# Patient Record
Sex: Male | Born: 1955 | Race: White | Hispanic: No | Smoking: Former smoker
Health system: Southern US, Community
[De-identification: ages and names within clinical notes are randomized; demographics above are authoritative.]

## PROBLEM LIST (undated history)

## (undated) DIAGNOSIS — R7303 Prediabetes: Secondary | ICD-10-CM

## (undated) DIAGNOSIS — M1990 Unspecified osteoarthritis, unspecified site: Secondary | ICD-10-CM

## (undated) DIAGNOSIS — J439 Emphysema, unspecified: Secondary | ICD-10-CM

## (undated) DIAGNOSIS — K219 Gastro-esophageal reflux disease without esophagitis: Secondary | ICD-10-CM

## (undated) DIAGNOSIS — E785 Hyperlipidemia, unspecified: Secondary | ICD-10-CM

## (undated) DIAGNOSIS — I10 Essential (primary) hypertension: Secondary | ICD-10-CM

## (undated) HISTORY — PX: HERNIA REPAIR: SHX51

## (undated) HISTORY — PX: KNEE SURGERY: SHX244

## (undated) HISTORY — PX: CYSTECTOMY: SUR359

## (undated) HISTORY — PX: OTHER SURGICAL HISTORY: SHX169

---

## 2002-01-11 ENCOUNTER — Encounter: Payer: Self-pay | Admitting: Family Medicine

## 2002-01-11 ENCOUNTER — Ambulatory Visit (HOSPITAL_COMMUNITY): Admission: RE | Admit: 2002-01-11 | Discharge: 2002-01-11 | Payer: Self-pay | Admitting: Family Medicine

## 2003-08-22 ENCOUNTER — Encounter: Payer: Self-pay | Admitting: Family Medicine

## 2003-08-22 ENCOUNTER — Ambulatory Visit (HOSPITAL_COMMUNITY): Admission: RE | Admit: 2003-08-22 | Discharge: 2003-08-22 | Payer: Self-pay | Admitting: Family Medicine

## 2003-11-27 ENCOUNTER — Emergency Department (HOSPITAL_COMMUNITY): Admission: EM | Admit: 2003-11-27 | Discharge: 2003-11-27 | Payer: Self-pay | Admitting: Emergency Medicine

## 2005-02-25 ENCOUNTER — Ambulatory Visit (HOSPITAL_COMMUNITY): Admission: RE | Admit: 2005-02-25 | Discharge: 2005-02-25 | Payer: Self-pay | Admitting: Family Medicine

## 2008-02-09 ENCOUNTER — Emergency Department (HOSPITAL_COMMUNITY): Admission: EM | Admit: 2008-02-09 | Discharge: 2008-02-09 | Payer: Self-pay | Admitting: Emergency Medicine

## 2008-07-07 ENCOUNTER — Inpatient Hospital Stay (HOSPITAL_COMMUNITY): Admission: EM | Admit: 2008-07-07 | Discharge: 2008-07-08 | Payer: Self-pay | Admitting: Emergency Medicine

## 2008-07-10 ENCOUNTER — Encounter (HOSPITAL_COMMUNITY): Admission: RE | Admit: 2008-07-10 | Discharge: 2008-07-28 | Payer: Self-pay | Admitting: Family Medicine

## 2008-07-11 ENCOUNTER — Ambulatory Visit (HOSPITAL_COMMUNITY): Admission: RE | Admit: 2008-07-11 | Discharge: 2008-07-11 | Payer: Self-pay | Admitting: Family Medicine

## 2009-07-24 ENCOUNTER — Ambulatory Visit (HOSPITAL_COMMUNITY): Admission: RE | Admit: 2009-07-24 | Discharge: 2009-07-24 | Payer: Self-pay | Admitting: Family Medicine

## 2010-01-12 ENCOUNTER — Ambulatory Visit (HOSPITAL_COMMUNITY)
Admission: RE | Admit: 2010-01-12 | Discharge: 2010-01-12 | Payer: Self-pay | Admitting: Physical Medicine and Rehabilitation

## 2010-11-23 ENCOUNTER — Encounter: Payer: Self-pay | Admitting: Internal Medicine

## 2010-11-29 ENCOUNTER — Encounter: Payer: Self-pay | Admitting: Internal Medicine

## 2010-12-02 NOTE — Letter (Addendum)
Summary: TRIAGE  TRIAGE   Imported By: Rexene Alberts 11/23/2010 12:22:09  _____________________________________________________________________  External Attachment:    Type:   Image     Comment:   External Document  Appended Document: TRIAGE ok as is

## 2010-12-08 NOTE — Letter (Signed)
Summary: [facility] INSTRUCTIONS  [facility] INSTRUCTIONS   Imported By: Ave Filter 11/29/2010 11:08:35  _____________________________________________________________________  External Attachment:    Type:   Image     Comment:   External Document

## 2010-12-24 ENCOUNTER — Ambulatory Visit (HOSPITAL_COMMUNITY)
Admission: RE | Admit: 2010-12-24 | Payer: BC Managed Care – PPO | Source: Ambulatory Visit | Admitting: Internal Medicine

## 2010-12-24 ENCOUNTER — Encounter: Payer: Self-pay | Admitting: Internal Medicine

## 2011-01-30 HISTORY — PX: OTHER SURGICAL HISTORY: SHX169

## 2011-02-14 ENCOUNTER — Encounter: Payer: BC Managed Care – PPO | Admitting: Internal Medicine

## 2011-02-14 ENCOUNTER — Ambulatory Visit (HOSPITAL_COMMUNITY)
Admission: RE | Admit: 2011-02-14 | Discharge: 2011-02-14 | Disposition: A | Discharge status: Home / Self Care | Payer: BC Managed Care – PPO | Source: Ambulatory Visit | Attending: Internal Medicine | Admitting: Internal Medicine

## 2011-02-14 DIAGNOSIS — K5730 Diverticulosis of large intestine without perforation or abscess without bleeding: Secondary | ICD-10-CM | POA: Insufficient documentation

## 2011-02-14 DIAGNOSIS — Z800 Family history of malignant neoplasm of digestive organs: Secondary | ICD-10-CM | POA: Insufficient documentation

## 2011-02-14 DIAGNOSIS — Z79899 Other long term (current) drug therapy: Secondary | ICD-10-CM | POA: Insufficient documentation

## 2011-02-14 DIAGNOSIS — Z1211 Encounter for screening for malignant neoplasm of colon: Secondary | ICD-10-CM | POA: Insufficient documentation

## 2011-02-14 DIAGNOSIS — I10 Essential (primary) hypertension: Secondary | ICD-10-CM | POA: Insufficient documentation

## 2011-02-15 NOTE — Op Note (Signed)
  NAMENEEMA, FLUEGGE                 ACCOUNT NO.:  [id number]  MEDICAL RECORD NO.:  [id number]           PATIENT TYPE:  O  LOCATION:  DAYP                          FACILITY:  [facility]  PHYSICIAN:  R. Roetta Sessions, M.D. DATE OF BIRTH:  02-16-1956  DATE OF PROCEDURE: DATE OF DISCHARGE:                              OPERATIVE REPORT   INDICATIONS FOR PROCEDURE:  A 55 year old gentleman referred by Dr. Casimiro Needle for screening colonoscopy.  He has no lower GI tract symptoms. No family history of colon polyps or colon cancer.  His first-degree relatives did have colon cancer.  Colonoscopy is now being done as standard screening maneuver.  Risks, benefits, limitations, alternatives, and imponderables have been reviewed, questions answered. Please see his old medical record.  PROCEDURE NOTE:  O2 saturation, blood pressure, pulse, respirations were monitored throughout the entire procedure.  CONSCIOUS SEDATION: 1. Versed 5 mg IV. 2. Demerol 100 mg IV in divided doses.  INSTRUMENT:  Pentax video chip system.  FINDINGS:  Digital rectal exam revealed no abnormalities.  Endoscopic findings:  Prep was adequate.  Colon:  Colonic mucosa was surveyed from the rectosigmoid junction through the left transverse right colon to the appendiceal orifice, ileocecal valve/cecum.  These structures were seen and photographed for the record.  From this level, scope was slowly and cautiously withdrawn.  All previous mucosal surfaces were again seen. The patient had scattered left-sided diverticulum.  Colonic mucosa appeared normal.  Scope was pulled down into the rectum where thorough examination of the rectal mucosa including retroflexed view of the anal verge revealed an abnormal senile anal papilla.  The patient tolerated the procedure well.  Cecal withdrawal time 11 minutes.  IMPRESSION: 1. Senile anal papilla, otherwise normal rectum. 2. Scattered left-sided diverticulum and colonic mucosa appeared  normal.  RECOMMENDATIONS: 1. Diverticulosis literature provided to Mr. Fluke. 2. Recommend repeat screening colonoscopy in 10 years.     Jonathon Bellows, M.D.     RMR/MEDQ  D:  02/14/2011  T:  02/14/2011  Job:  [id number]  Electronically Signed by Lorrin Goodell M.D. on 02/15/2011 07:45:41 AM

## 2011-02-28 ENCOUNTER — Other Ambulatory Visit (HOSPITAL_COMMUNITY): Payer: Self-pay | Admitting: Neurosurgery

## 2011-02-28 DIAGNOSIS — M545 Low back pain, unspecified: Secondary | ICD-10-CM

## 2011-03-02 ENCOUNTER — Other Ambulatory Visit (HOSPITAL_COMMUNITY): Payer: BC Managed Care – PPO

## 2011-03-02 ENCOUNTER — Ambulatory Visit (HOSPITAL_COMMUNITY)
Admission: RE | Admit: 2011-03-02 | Discharge: 2011-03-02 | Disposition: A | Discharge status: Home / Self Care | Payer: BC Managed Care – PPO | Source: Ambulatory Visit | Attending: Neurosurgery | Admitting: Neurosurgery

## 2011-03-02 DIAGNOSIS — M545 Low back pain, unspecified: Secondary | ICD-10-CM

## 2011-03-02 DIAGNOSIS — M25559 Pain in unspecified hip: Secondary | ICD-10-CM | POA: Insufficient documentation

## 2011-03-02 DIAGNOSIS — M5137 Other intervertebral disc degeneration, lumbosacral region: Secondary | ICD-10-CM | POA: Insufficient documentation

## 2011-03-02 DIAGNOSIS — M51379 Other intervertebral disc degeneration, lumbosacral region without mention of lumbar back pain or lower extremity pain: Secondary | ICD-10-CM | POA: Insufficient documentation

## 2011-03-15 NOTE — H&P (Signed)
Marc Garcia, Marc Garcia                 ACCOUNT NO.:  [id number]   MEDICAL RECORD NO.:  [id number]          PATIENT TYPE:  INP   LOCATION:  A307                          FACILITY:  [facility]   PHYSICIAN:  Dorris Singh, DO    DATE OF BIRTH:  June 18, 1956   DATE OF ADMISSION:  07/07/2008  DATE OF DISCHARGE:  LH                              HISTORY & PHYSICAL   PRIMARY CARE PHYSICIAN:  Patrica Duel, M.D.   CHIEF COMPLAINT:  Is back pain.   The patient is a 55 year old Caucasian male who was brought in via EMS  with a complaint of extreme back pain.  Apparently he went to the  bathroom and felt a spasm and it was stated the pain was rated a 10/10  and that he had to lay on the floor and could not move.  He called his  wife who subsequently called EMS to bring him into the hospital.  The  patient states that he has had an episode like this several years ago  but it was not to this extent.  While the patient was in the hospital he  has received over 14 mg of morphine with minimal results.  He states  though since being in the ED, he does have a little bit more movement,  but he is still in a lot of discomfort.  I discussed with the patient  regarding his plan of care that due to the nature of this possibly being  muscle spasms since there was no trauma to the area prior to him having  this incident that NSAIDS and muscle relaxers would be the plan of  course and that we would hold back on a lot of pain medication.  The  patient stated understanding.   PAST MEDICAL HISTORY:  Significant for chronic back pain.   SURGICAL HISTORY:  Significant for hernia repair.   SOCIAL HISTORY:  He is a nondrinker.  No drug abuse and he smokes one-  pack per day.  He has no known drug allergies.   MEDICATIONS:  He is currently on hydrochlorothiazide but we do not have  a dose.   REVIEW OF SYSTEMS:  CONSTITUTIONAL:  Negative for weight loss, fever,  chills.  EYES:  Negative for changes in vision.  Ears,  nose, mouth and  throat negative in hearing, smell or sore throat.  CARDIOVASCULAR:  Negative for chest pain.  RESPIRATORY:  Negative for dyspnea or  shortness of breath.  GASTROINTESTINAL:  Negative for nausea, vomiting,  diarrhea, constipation.  GU:  Negative for dysuria or hesitancy.  MUSCULOSKELETAL:  Positive for back pain.  Positive for back spasm.  SKIN:  Positive for pruritus.  NEURO:  Negative for any syncope or  dizziness.  PSYCHIATRIC:  Negative for depression.  METABOLIC:  Negative  for intolerance to heat or cold.  HEMATOLOGIC:  Negative for anemia.   PHYSICAL EXAM:  VITAL SIGNS:  Temperature 97.7, pulse 57, respirations  20, blood pressure 129/82.  GENERAL:  The patient is a well-developed, well-nourished Caucasian 13-  year-old male who as in no acute distress.  HEENT:  Head is normocephalic, atraumatic.  Eyes: EOMI.  PERRL.  Sclerae  is clear.  No icterus noted.  Ears, nose and mouth are all normal.  Throat is clear.  No erythema or exudate noted.  NECK:  Neck is supple.  No lymphadenopathy.  Full range of motion.  CARDIOVASCULAR:  Regular rate and rhythm.  No murmurs, rubs or gallops.  RESPIRATORY:  Clear to auscultation bilaterally.  No wheezes, rales or  rhonchi.  SPINE:  Lumbar paraspinal tenderness noted.  ABDOMEN:  Soft, nontender, nondistended.  Bowel sounds present in all  four quadrants.  EXTREMITIES:  Full range of motion of upper extremities.  Limited range  of motion of lower extremities.  NEURO:  Cranial nerves II-XII grossly intact.  Alert and oriented x3.  SKIN:  Positive excoriations on lower half of body.  The patient states  he feels like he is itchy all over.  PSYCHIATRIC:  Good affect.  Normal affect.  No anxiety, depression  noted.   LABORATORY DATA:  His BMET that was ordered; sodium 138, potassium 4.6,  chloride 105, carbon dioxide 29, glucose 95, BUN 8, creatinine 0.87.   ASSESSMENT AND PLAN:  1. Intractable back pain.  2. Muscle spasm.   3. Disturbance of gait.   PLAN:  1. Admit the patient to service of [facility].  2. Admit.  Will put the patient on home medications until we get a      dose we do not know with hydrochlorothiazide doses.  We will place      the patient on NSAIDS and muscle relaxers.  We will do GI and DVT      prophylaxis.  Place him on a regular diet and give him IV      hydration.  We will continue to monitor him and have PT, OT to come      take a look at him and give any recommendations.  I suspect the      patient will be discharged within the next 24-48 hours, barring any      complications.  As mentioned before I talked to patient regarding      pain management, that really this is an issue of muscle spasms      which respond better to muscle relaxers and NSAIDS.  So we will      continue on the course.  Also he has been put on IV steroids to see      if this will quicken the process and get him more resolved sooner.      We will also do warm compresses and cold compresses to the area.      Dorris Singh, DO  Electronically Signed     CB/MEDQ  D:  07/07/2008  T:  07/08/2008  Job:  [id number]

## 2011-03-15 NOTE — Discharge Summary (Signed)
NAMEFLORENCIO, Marc Garcia                 ACCOUNT NO.:  [id number]   MEDICAL RECORD NO.:  [id number]          PATIENT TYPE:  INP   LOCATION:  A307                          FACILITY:  [facility]   PHYSICIAN:  Dorris Singh, DO    DATE OF BIRTH:  [birth year]   DATE OF ADMISSION:  07/07/2008  DATE OF DISCHARGE:  09/08/2009LH                               DISCHARGE SUMMARY   ADMISSION DIAGNOSES:  1. Intractable back pain.  2. Back spasms.  3. Disturbance of gait.   DISCHARGE DIAGNOSES:  1. Intractable back pain resolved.  2. Back spasms resolving.   PRIMARY CARE PHYSICIAN:  Patrica Duel, M.D.  His H&P was done by Dr.  Elige Radon.  No testing was done.   CONSULTATIONS:  Physical therapy.   To summarize the patient is a 55 year old Caucasian male who was brought  in via EMS with complaints of extreme back pain.  Apparently went to the  bathroom had extreme spasm and could not stand up and ended up laying on  the floor in which he could not get up.  At that point in time EMS came  and picked him up and brought him to [facility] to be evaluated.  He  rated his pain as 10/10.  He was given several narcotic medications and  benzodiazepines in the ED which did not improve his pain.  At this point  in time it was determined after talking with the ED doctor that he  should be admitted at least in observation to see if we can get his pain  under control and to try to get spasms under control.  He was admitted  then to the service of [facility].   HOSPITAL COURSE:  The patient was started on IV fluids, steroids and  NSAIDS as well as muscle relaxers.  Also recommended that he do a little  bit of exercising and stretching while he was in the bed. The patient  was seen the next day stated that he did have little bit more movement.  It was noted that he was moving more in the bed.  I explained to him  that we would have PT evaluate him and they did.  They recommended that  he have an outpatient rehab  program for core muscle strength once his  back pain has subsided and that he needs a Driggs for gait but this  should be very short term and he needs a home exercise program for him  at this point in time.  It was determined that he was given the  information for outpatient rehab which is what was recommended for him  for about a week.  Since the patient improved and he was able to move  more it was determined that he could be discharged to home and he was  with specific instructions.   DISCHARGE INSTRUCTIONS:  1. He was sent home on all of his home medications which include:      a.     Hydrochlorothiazide which we did not get a dose for and he       did  not receive while he was here because we did not determine his       dose.      b.     Medrol Dosepak use as directed.      c.     Naprosyn 500 mg one p.o. t.i.d. take with food #30.      d.     Skelaxin 800 mg one p.o. q.i.d. #20.  2. He is instructed to follow up with Dr. Nobie Putnam in 1-3 days after      discharge.  3. To increase activity slowly.  4. To use warm cold compresses to region.  5. Daily stretching.  6. He will be PT to evaluate him for home program.  7. He was scheduled to do outpatient rehab starting on Thursday 10th      at 9:00 a.m. so we have set this up for him.   CONDITION ON DISCHARGE:  His condition was stable.   DISPOSITION:  His disposition is to home.      Dorris Singh, DO  Electronically Signed     CB/MEDQ  D:  07/08/2008  T:  07/08/2008  Job:  [id number]   cc:   Patrica Duel, M.D.  Fax: [phone]

## 2011-03-16 ENCOUNTER — Ambulatory Visit (HOSPITAL_COMMUNITY)
Admission: RE | Admit: 2011-03-16 | Discharge: 2011-03-16 | Disposition: A | Discharge status: Home / Self Care | Payer: BC Managed Care – PPO | Source: Ambulatory Visit | Attending: Family Medicine | Admitting: Family Medicine

## 2011-03-16 DIAGNOSIS — M6281 Muscle weakness (generalized): Secondary | ICD-10-CM | POA: Insufficient documentation

## 2011-03-16 DIAGNOSIS — M545 Low back pain, unspecified: Secondary | ICD-10-CM | POA: Insufficient documentation

## 2011-03-16 DIAGNOSIS — I10 Essential (primary) hypertension: Secondary | ICD-10-CM | POA: Insufficient documentation

## 2011-03-16 DIAGNOSIS — IMO0001 Reserved for inherently not codable concepts without codable children: Secondary | ICD-10-CM | POA: Insufficient documentation

## 2011-03-18 ENCOUNTER — Ambulatory Visit (HOSPITAL_COMMUNITY)
Admission: RE | Admit: 2011-03-18 | Discharge: 2011-03-18 | Disposition: A | Discharge status: Home / Self Care | Payer: BC Managed Care – PPO | Source: Ambulatory Visit | Attending: Family Medicine | Admitting: Family Medicine

## 2011-03-22 ENCOUNTER — Ambulatory Visit (HOSPITAL_COMMUNITY)
Admission: RE | Admit: 2011-03-22 | Discharge: 2011-03-22 | Disposition: A | Discharge status: Home / Self Care | Payer: BC Managed Care – PPO | Source: Ambulatory Visit | Attending: Family Medicine | Admitting: Family Medicine

## 2011-03-24 ENCOUNTER — Ambulatory Visit (HOSPITAL_COMMUNITY)
Admission: RE | Admit: 2011-03-24 | Discharge: 2011-03-24 | Disposition: A | Discharge status: Home / Self Care | Payer: BC Managed Care – PPO | Source: Ambulatory Visit | Attending: Family Medicine | Admitting: Family Medicine

## 2011-03-29 ENCOUNTER — Ambulatory Visit (HOSPITAL_COMMUNITY)
Admission: RE | Admit: 2011-03-29 | Discharge: 2011-03-29 | Disposition: A | Discharge status: Home / Self Care | Payer: BC Managed Care – PPO | Source: Ambulatory Visit | Attending: Family Medicine | Admitting: Family Medicine

## 2011-03-31 ENCOUNTER — Ambulatory Visit (HOSPITAL_COMMUNITY)
Admission: RE | Admit: 2011-03-31 | Discharge: 2011-03-31 | Disposition: A | Discharge status: Home / Self Care | Payer: BC Managed Care – PPO | Source: Ambulatory Visit | Attending: Family Medicine | Admitting: Family Medicine

## 2011-04-05 ENCOUNTER — Ambulatory Visit (HOSPITAL_COMMUNITY)
Admission: RE | Admit: 2011-04-05 | Discharge: 2011-04-05 | Disposition: A | Discharge status: Home / Self Care | Payer: BC Managed Care – PPO | Source: Ambulatory Visit | Attending: Neurosurgery | Admitting: Neurosurgery

## 2011-04-05 DIAGNOSIS — M545 Low back pain, unspecified: Secondary | ICD-10-CM | POA: Insufficient documentation

## 2011-04-05 DIAGNOSIS — M6281 Muscle weakness (generalized): Secondary | ICD-10-CM | POA: Insufficient documentation

## 2011-04-05 DIAGNOSIS — I10 Essential (primary) hypertension: Secondary | ICD-10-CM | POA: Insufficient documentation

## 2011-04-05 DIAGNOSIS — IMO0001 Reserved for inherently not codable concepts without codable children: Secondary | ICD-10-CM | POA: Insufficient documentation

## 2011-04-07 ENCOUNTER — Ambulatory Visit (HOSPITAL_COMMUNITY)
Admission: RE | Admit: 2011-04-07 | Discharge: 2011-04-07 | Disposition: A | Discharge status: Home / Self Care | Payer: BC Managed Care – PPO | Source: Ambulatory Visit | Attending: Family Medicine | Admitting: Family Medicine

## 2011-04-13 ENCOUNTER — Inpatient Hospital Stay (HOSPITAL_COMMUNITY)
Admission: RE | Admit: 2011-04-13 | Discharge: 2011-04-13 | Disposition: A | Discharge status: Home / Self Care | Payer: BC Managed Care – PPO | Source: Ambulatory Visit | Attending: Physical Therapy | Admitting: Physical Therapy

## 2011-04-26 ENCOUNTER — Ambulatory Visit (HOSPITAL_COMMUNITY)
Admission: RE | Admit: 2011-04-26 | Discharge: 2011-04-26 | Disposition: A | Discharge status: Home / Self Care | Payer: BC Managed Care – PPO | Source: Ambulatory Visit | Attending: Family Medicine | Admitting: Family Medicine

## 2011-04-28 ENCOUNTER — Ambulatory Visit (HOSPITAL_COMMUNITY)
Admission: RE | Admit: 2011-04-28 | Discharge: 2011-04-28 | Disposition: A | Discharge status: Home / Self Care | Payer: BC Managed Care – PPO | Source: Ambulatory Visit | Attending: Family Medicine | Admitting: Family Medicine

## 2011-05-03 ENCOUNTER — Ambulatory Visit (HOSPITAL_COMMUNITY)
Admission: RE | Admit: 2011-05-03 | Discharge: 2011-05-03 | Disposition: A | Discharge status: Home / Self Care | Payer: BC Managed Care – PPO | Source: Ambulatory Visit | Attending: Neurosurgery | Admitting: Neurosurgery

## 2011-05-03 DIAGNOSIS — M545 Low back pain, unspecified: Secondary | ICD-10-CM | POA: Insufficient documentation

## 2011-05-03 DIAGNOSIS — M6281 Muscle weakness (generalized): Secondary | ICD-10-CM | POA: Insufficient documentation

## 2011-05-03 DIAGNOSIS — I10 Essential (primary) hypertension: Secondary | ICD-10-CM | POA: Insufficient documentation

## 2011-05-03 DIAGNOSIS — IMO0001 Reserved for inherently not codable concepts without codable children: Secondary | ICD-10-CM | POA: Insufficient documentation

## 2011-05-05 ENCOUNTER — Ambulatory Visit (HOSPITAL_COMMUNITY)
Admission: RE | Admit: 2011-05-05 | Discharge: 2011-05-05 | Disposition: A | Discharge status: Home / Self Care | Payer: BC Managed Care – PPO | Source: Ambulatory Visit | Attending: Family Medicine | Admitting: Family Medicine

## 2011-05-10 ENCOUNTER — Ambulatory Visit (HOSPITAL_COMMUNITY)
Admission: RE | Admit: 2011-05-10 | Discharge: 2011-05-10 | Disposition: A | Discharge status: Home / Self Care | Payer: BC Managed Care – PPO | Source: Ambulatory Visit

## 2011-05-10 DIAGNOSIS — M545 Low back pain, unspecified: Secondary | ICD-10-CM | POA: Insufficient documentation

## 2011-05-10 NOTE — Progress Notes (Addendum)
Physical Therapy Treatment Patient Name: Marc Garcia EAVWU'J Date: 05/10/2011  Diagnosis: spondylosis dx code 724.2 Time In: 4:32 Time Out: 5:20    Subjective: 3-4/10 lower back and B- hips pain.  "Go easy on me today, it's been a long one."  When asked about the SI MET pain following last session pt stated pain decreased until sitting for long periods of time then pain associated with transition from sit -- stand.  Objective:     Exercise/Treatments Functional squats, then proper functional lifting 8# box 10 reps with 10# inside box.  Wall slides 10 repsx 5" hold. Prone/quadruped SAR/SLR 15 reps., S/L Clam B 20 reps.  Supine: Bridge 20 reps, Bent knee raise 20 reps, B SLR 20 reps, SAQ 20x 5" hold Bilateral. @FLOW ([id number])@  Assessment: Pt. progressing well towards all goals.  LE strengthening- presented with full knee extension with SAQ L LE.  SI within alignment, no MET required.  Referral request refaxed requesting TENS unit (orinigal request received today with no MD signature so re-wrote the fax cover sheet and refaxed to MD for approval.)  Plan: Re-assess next session.  Juel Burrow 05/10/2011, 4:52 PM

## 2011-05-12 ENCOUNTER — Ambulatory Visit (HOSPITAL_COMMUNITY)
Admission: RE | Admit: 2011-05-12 | Discharge: 2011-05-12 | Disposition: A | Discharge status: Home / Self Care | Payer: BC Managed Care – PPO | Source: Ambulatory Visit | Attending: Family Medicine | Admitting: Family Medicine

## 2011-05-12 NOTE — Progress Notes (Cosign Needed)
  Patient Name: MIQUEAS WHILDEN MRN: [id number] Today's Date: 05/12/2011        Physical Therapy Treatment Note Time In: 4:35 Time Out: 5:00  Subjective: 3-4/10 low back pain, it's pretty much the same do feel stronger and have better body mechanics with work but pain has not improved.  Pt stated no real time to perform HEP with 6, 12 hours work weeks.  Pt able to sit only 15 minutes in car before need to get out of vehicle and stretch back.  Pain scales best 3-4/10 to worst: 6/10.  Objective: 5 STS without UE assistance complete in 7.8 seconds (was 15.3 seconds on re-eval complete on 04/13/2011).   Exercises/Treatments: Warm up with elliptical 5 min L4 20 squats 10 reps functional lifting Bridges 20 reps Bent knee raise 20 reps SLR 20 reps  S/L Clam 20 reps B  Assessment: Improved functional abilities, better body mechanics with lifting, improve LE strength.  Pt stated no change in the pain level over last 4 weeks.  Plan: D/C PT secondary to no functional gains, pt. requested to be D/C secondary to no pain relief.   Charges: Functional assessment Therex: 20 min  Becky Sax, PTA  Juel Burrow 05/12/2011, 4:53 PM

## 2011-05-17 ENCOUNTER — Inpatient Hospital Stay (HOSPITAL_COMMUNITY)
Admission: RE | Admit: 2011-05-17 | Payer: BC Managed Care – PPO | Source: Ambulatory Visit | Admitting: Physical Therapy

## 2011-05-19 ENCOUNTER — Ambulatory Visit (HOSPITAL_COMMUNITY): Payer: BC Managed Care – PPO | Admitting: Physical Therapy

## 2011-05-20 ENCOUNTER — Ambulatory Visit (HOSPITAL_COMMUNITY): Payer: BC Managed Care – PPO | Admitting: Physical Therapy

## 2011-07-26 LAB — COMPREHENSIVE METABOLIC PANEL
ALT: 24
CO2: 24
Calcium: 9.7
Chloride: 109
Creatinine, Ser: 1.4
GFR calc non Af Amer: 53 — ABNORMAL LOW
Glucose, Bld: 145 — ABNORMAL HIGH
Sodium: 144
Total Bilirubin: 0.7

## 2011-07-26 LAB — DIFFERENTIAL
Basophils Absolute: 0
Eosinophils Absolute: 0
Eosinophils Relative: 0
Lymphs Abs: 0.9
Neutrophils Relative %: 79 — ABNORMAL HIGH

## 2011-07-26 LAB — URINALYSIS, ROUTINE W REFLEX MICROSCOPIC
Glucose, UA: NEGATIVE
Leukocytes, UA: NEGATIVE
Nitrite: NEGATIVE
Protein, ur: 100 — AB
Urobilinogen, UA: 0.2

## 2011-07-26 LAB — CBC
Hemoglobin: 16.3
MCHC: 34
MCV: 86
RBC: 5.57
WBC: 7.9

## 2011-07-26 LAB — URINE MICROSCOPIC-ADD ON

## 2011-08-03 LAB — BASIC METABOLIC PANEL
BUN: 11
BUN: 8
CO2: 26
CO2: 29
Chloride: 105
Chloride: 106
Creatinine, Ser: 0.87
Creatinine, Ser: 0.96
Glucose, Bld: 95
Potassium: 3.9
Potassium: 4.6

## 2011-08-03 LAB — URINALYSIS, ROUTINE W REFLEX MICROSCOPIC
Bilirubin Urine: NEGATIVE
Glucose, UA: NEGATIVE
Hgb urine dipstick: NEGATIVE
Ketones, ur: NEGATIVE
Protein, ur: NEGATIVE
pH: 5.5

## 2012-05-11 ENCOUNTER — Other Ambulatory Visit (HOSPITAL_COMMUNITY): Payer: Self-pay | Admitting: Family Medicine

## 2012-05-11 ENCOUNTER — Ambulatory Visit (HOSPITAL_COMMUNITY)
Admission: RE | Admit: 2012-05-11 | Discharge: 2012-05-11 | Disposition: A | Discharge status: Home / Self Care | Payer: Managed Care, Other (non HMO) | Source: Ambulatory Visit | Attending: Family Medicine | Admitting: Family Medicine

## 2012-05-11 DIAGNOSIS — R05 Cough: Secondary | ICD-10-CM | POA: Insufficient documentation

## 2012-05-11 DIAGNOSIS — R9389 Abnormal findings on diagnostic imaging of other specified body structures: Secondary | ICD-10-CM

## 2012-05-11 DIAGNOSIS — R059 Cough, unspecified: Secondary | ICD-10-CM

## 2012-05-11 DIAGNOSIS — F17200 Nicotine dependence, unspecified, uncomplicated: Secondary | ICD-10-CM | POA: Insufficient documentation

## 2013-03-13 ENCOUNTER — Other Ambulatory Visit: Payer: Self-pay | Admitting: *Deleted

## 2013-03-13 MED ORDER — NEBIVOLOL HCL 2.5 MG PO TABS: 2.5000 mg | ORAL_TABLET | Freq: Every day | ORAL | Status: DC

## 2013-03-14 MED ORDER — NEBIVOLOL HCL 2.5 MG PO TABS: 2.5000 mg | ORAL_TABLET | Freq: Every day | ORAL | Status: DC

## 2013-03-14 NOTE — Telephone Encounter (Addendum)
rx faxed to [facility]

## 2013-03-14 NOTE — Addendum Note (Signed)
Addended by: Barrie Dunker on: 03/14/2013 05:14 PM   Modules accepted: Orders

## 2013-08-16 ENCOUNTER — Other Ambulatory Visit (HOSPITAL_COMMUNITY): Payer: Self-pay | Admitting: Family Medicine

## 2013-08-16 ENCOUNTER — Ambulatory Visit (HOSPITAL_COMMUNITY)
Admission: RE | Admit: 2013-08-16 | Discharge: 2013-08-16 | Disposition: A | Discharge status: Home / Self Care | Payer: Managed Care, Other (non HMO) | Source: Ambulatory Visit | Attending: Family Medicine | Admitting: Family Medicine

## 2013-08-16 DIAGNOSIS — I10 Essential (primary) hypertension: Secondary | ICD-10-CM

## 2013-08-16 DIAGNOSIS — R05 Cough: Secondary | ICD-10-CM | POA: Insufficient documentation

## 2013-08-16 DIAGNOSIS — R059 Cough, unspecified: Secondary | ICD-10-CM | POA: Insufficient documentation

## 2013-08-16 DIAGNOSIS — Z87891 Personal history of nicotine dependence: Secondary | ICD-10-CM | POA: Insufficient documentation

## 2013-08-16 DIAGNOSIS — J449 Chronic obstructive pulmonary disease, unspecified: Secondary | ICD-10-CM

## 2013-10-02 ENCOUNTER — Other Ambulatory Visit (HOSPITAL_COMMUNITY): Payer: Self-pay | Admitting: Rheumatology

## 2013-10-02 DIAGNOSIS — M549 Dorsalgia, unspecified: Secondary | ICD-10-CM

## 2013-10-07 ENCOUNTER — Ambulatory Visit (HOSPITAL_COMMUNITY)
Admission: RE | Admit: 2013-10-07 | Discharge: 2013-10-07 | Disposition: A | Discharge status: Home / Self Care | Payer: Managed Care, Other (non HMO) | Source: Ambulatory Visit | Attending: Rheumatology | Admitting: Rheumatology

## 2013-10-07 DIAGNOSIS — M549 Dorsalgia, unspecified: Secondary | ICD-10-CM

## 2013-10-07 DIAGNOSIS — M5126 Other intervertebral disc displacement, lumbar region: Secondary | ICD-10-CM | POA: Insufficient documentation

## 2013-10-07 DIAGNOSIS — M545 Low back pain, unspecified: Secondary | ICD-10-CM | POA: Insufficient documentation

## 2013-10-07 DIAGNOSIS — M47817 Spondylosis without myelopathy or radiculopathy, lumbosacral region: Secondary | ICD-10-CM | POA: Insufficient documentation

## 2013-10-07 DIAGNOSIS — M539 Dorsopathy, unspecified: Secondary | ICD-10-CM | POA: Insufficient documentation

## 2014-05-17 ENCOUNTER — Encounter (HOSPITAL_COMMUNITY): Payer: Self-pay | Admitting: Emergency Medicine

## 2014-05-17 ENCOUNTER — Emergency Department (HOSPITAL_COMMUNITY)
Admission: EM | Admit: 2014-05-17 | Discharge: 2014-05-18 | Disposition: A | Discharge status: Home / Self Care | Payer: Managed Care, Other (non HMO) | Attending: Emergency Medicine | Admitting: Emergency Medicine

## 2014-05-17 ENCOUNTER — Emergency Department (HOSPITAL_COMMUNITY): Payer: Managed Care, Other (non HMO)

## 2014-05-17 DIAGNOSIS — I9589 Other hypotension: Secondary | ICD-10-CM

## 2014-05-17 DIAGNOSIS — Z862 Personal history of diseases of the blood and blood-forming organs and certain disorders involving the immune mechanism: Secondary | ICD-10-CM | POA: Insufficient documentation

## 2014-05-17 DIAGNOSIS — I10 Essential (primary) hypertension: Secondary | ICD-10-CM | POA: Insufficient documentation

## 2014-05-17 DIAGNOSIS — R42 Dizziness and giddiness: Secondary | ICD-10-CM | POA: Insufficient documentation

## 2014-05-17 DIAGNOSIS — R0602 Shortness of breath: Secondary | ICD-10-CM | POA: Insufficient documentation

## 2014-05-17 DIAGNOSIS — Z8639 Personal history of other endocrine, nutritional and metabolic disease: Secondary | ICD-10-CM | POA: Insufficient documentation

## 2014-05-17 DIAGNOSIS — Z79899 Other long term (current) drug therapy: Secondary | ICD-10-CM | POA: Insufficient documentation

## 2014-05-17 HISTORY — DX: Essential (primary) hypertension: I10

## 2014-05-17 HISTORY — DX: Hyperlipidemia, unspecified: E78.5

## 2014-05-17 HISTORY — DX: Prediabetes: R73.03

## 2014-05-17 LAB — CBC
HCT: 35.8 % — ABNORMAL LOW (ref 39.0–52.0)
HEMOGLOBIN: 11.8 g/dL — AB (ref 13.0–17.0)
MCH: 30.5 pg (ref 26.0–34.0)
MCHC: 33 g/dL (ref 30.0–36.0)
MCV: 92.5 fL (ref 78.0–100.0)
Platelets: 175 10*3/uL (ref 150–400)
RBC: 3.87 MIL/uL — AB (ref 4.22–5.81)
RDW: 14.3 % (ref 11.5–15.5)
WBC: 10.4 10*3/uL (ref 4.0–10.5)

## 2014-05-17 LAB — BASIC METABOLIC PANEL
ANION GAP: 12 (ref 5–15)
BUN: 28 mg/dL — ABNORMAL HIGH (ref 6–23)
CO2: 25 mEq/L (ref 19–32)
Calcium: 9.3 mg/dL (ref 8.4–10.5)
Chloride: 103 mEq/L (ref 96–112)
Creatinine, Ser: 1.72 mg/dL — ABNORMAL HIGH (ref 0.50–1.35)
GFR, EST AFRICAN AMERICAN: 49 mL/min — AB (ref >90)
GFR, EST NON AFRICAN AMERICAN: 42 mL/min — AB (ref >90)
GLUCOSE: 104 mg/dL — AB (ref 70–99)
POTASSIUM: 4.4 meq/L (ref 3.7–5.3)
SODIUM: 140 meq/L (ref 137–147)

## 2014-05-17 LAB — CBG MONITORING, ED: Glucose-Capillary: 107 mg/dL — ABNORMAL HIGH (ref 70–99)

## 2014-05-17 LAB — D-DIMER, QUANTITATIVE: D-Dimer, Quant: 1.34 ug/mL-FEU — ABNORMAL HIGH (ref 0.00–0.48)

## 2014-05-17 LAB — I-STAT TROPONIN, ED: TROPONIN I, POC: 0.01 ng/mL (ref 0.00–0.08)

## 2014-05-17 MED ORDER — IOHEXOL 350 MG/ML SOLN
100.0000 mL | Freq: Once | INTRAVENOUS | Status: AC | PRN
  Administered 2014-05-17: 100 mL via INTRAVENOUS

## 2014-05-17 NOTE — ED Provider Notes (Signed)
CSN: [id number]     Arrival date & time 05/17/14  2118 History   First MD Initiated Contact with Patient 05/17/14 2136     Chief Complaint  Patient presents with  . Hypotension    x2 weeks     (Consider location/radiation/quality/duration/timing/severity/associated sxs/prior Treatment) HPI Comments: 58 year old male with a past medical history of hypertension, hyperlipidemia and borderline diabetes presents to the emergency department with his wife complaining of hypotension x2 weeks. Patient states he has been feeling lightheaded at work and has been checking his blood pressure, they have been ranging from 57/40-90/40. States anytime he exerts himself he feels lightheaded in his muscles tense up. He saw his primary care physician 3 weeks ago and was told he was borderline diabetic so he adjusted his diet to be more healthy and has lost 10 pounds. There've been no medication changes. Admits to shortness of breath on exertion. Denies chest pain, fever, chills, nausea, vomiting, abdominal pain, headaches, extremity numbness or weakness, speech changes. Denies history of heart attack or stroke. Denies rectal bleeding. Denies ever having symptoms like this in the past. He has an appointment with his PCP on Wednesday. There have been no new symptoms causing him to come to the emergency department tonight, states his wife advised him to come.  The history is provided by the patient and the spouse.    Past Medical History  Diagnosis Date  . Borderline diabetic   . Hypertension   . Hyperlipidemia    Past Surgical History  Procedure Laterality Date  . Hernia repair    . Knee surgery Right   . Cystectomy     No family history on file. History  Substance Use Topics  . Smoking status: Never Smoker   . Smokeless tobacco: Never Used  . Alcohol Use: No    Review of Systems  Respiratory: Positive for shortness of breath.   Neurological: Positive for light-headedness.  All other systems  reviewed and are negative.     Allergies  Review of patient's allergies indicates no known allergies.  Home Medications   Prior to Admission medications   Medication Sig Start Date End Date Taking? Authorizing Provider  nebivolol (BYSTOLIC) 2.5 MG tablet Take 1 tablet (2.5 mg total) by mouth daily. 03/14/13   Rebecca Eaton, MD   BP 105/62  Pulse 68  Temp(Src) 98.1 F (36.7 C) (Oral)  Resp 18  SpO2 100% Physical Exam  Nursing note and vitals reviewed. Constitutional: He is oriented to person, place, and time. He appears well-developed and well-nourished. No distress.  HENT:  Head: Normocephalic and atraumatic.  Mouth/Throat: Oropharynx is clear and moist.  Eyes: Conjunctivae and EOM are normal. Pupils are equal, round, and reactive to light.  Neck: Normal range of motion. Neck supple. No JVD present.  Cardiovascular: Normal rate, regular rhythm, normal heart sounds and intact distal pulses.   No extremity edema.  Pulmonary/Chest: Effort normal and breath sounds normal. No respiratory distress.  Abdominal: Soft. Bowel sounds are normal. There is no tenderness.  Musculoskeletal: Normal range of motion. He exhibits no edema.  Neurological: He is alert and oriented to person, place, and time. He has normal strength. No sensory deficit.  Speech fluent, goal oriented. Moves limbs without ataxia. Equal grip strength bilateral.  Skin: Skin is warm and dry. He is not diaphoretic.  Psychiatric: He has a normal mood and affect. His behavior is normal.    ED Course  Procedures (including critical care time) Labs Review Labs Reviewed  CBC - Abnormal; Notable for the following:    RBC 3.87 (*)    Hemoglobin 11.8 (*)    HCT 35.8 (*)    All other components within normal limits  BASIC METABOLIC PANEL - Abnormal; Notable for the following:    Glucose, Bld 104 (*)    BUN 28 (*)    Creatinine, Ser 1.72 (*)    GFR calc non Af Amer 42 (*)    GFR calc Af Amer 49 (*)    All other  components within normal limits  D-DIMER, QUANTITATIVE - Abnormal; Notable for the following:    D-Dimer, Quant 1.34 (*)    All other components within normal limits  CBG MONITORING, ED - Abnormal; Notable for the following:    Glucose-Capillary 107 (*)    All other components within normal limits  I-STAT TROPOININ, ED    Imaging Review Dg Chest 2 View  05/17/2014   CLINICAL DATA:  HYPOTENSION  EXAM: CHEST  2 VIEW  COMPARISON:  08/16/2013  FINDINGS: The heart size and mediastinal contours are within normal limits. Both lungs are clear. The visualized skeletal structures are unremarkable.  IMPRESSION: No active cardiopulmonary disease.   Electronically Signed   By: Kathreen Devoid   On: 05/17/2014 22:30   Ct Angio Chest Pe W/cm &/or Wo Cm  05/18/2014   CLINICAL DATA:  Dizziness, low blood pressure  EXAM: CT ANGIOGRAPHY CHEST WITH CONTRAST  TECHNIQUE: Multidetector CT imaging of the chest was performed using the standard protocol during bolus administration of intravenous contrast. Multiplanar CT image reconstructions and MIPs were obtained to evaluate the vascular anatomy.  CONTRAST:  166mL OMNIPAQUE IOHEXOL 350 MG/ML SOLN  COMPARISON:  None.  FINDINGS: There is adequate opacification of the pulmonary arteries. There is no pulmonary embolus. The main pulmonary artery, right main pulmonary artery and left main pulmonary arteries are normal in size. The heart size is normal. There is no pericardial effusion.  The lungs are clear. There is no focal consolidation, pleural effusion or pneumothorax.  There is no axillary, hilar, or mediastinal adenopathy. There are scattered small mediastinal lymph nodes present measuring less than 1 cm.  There is no lytic or blastic osseous lesion.  The visualized portions of the upper abdomen are unremarkable.  Review of the MIP images confirms the above findings.  IMPRESSION: 1. No evidence of pulmonary embolus. 2. No active cardiopulmonary disease.   Electronically Signed    By: Kathreen Devoid   On: 05/18/2014 00:13     EKG Interpretation None      MDM   Final diagnoses:  Other specified hypotension   Patient presenting with hypotension and lightheadedness. He is well appearing and in no apparent distress. Afebrile, vital signs stable. Blood pressure 105/62. Labs, EKG, CXR pending. Pt receiving IV fluids. 10:46 PM D-dimer elevated. Will obtain CT angio chest. Hemoglobin decreased from result in 2009, patient denies any rectal bleeding. Creatinine elevated compared to prior results from 2009. 12:28 AM CT angio without evidence of PE or active cardiopulmonary disease. I discussed pt with Dr. Venora Maples. Symptoms possibly related to his HTN medications and recent lifestyle changes. Will have pt stop bystolic, and will switch azor to just olmesartan. Pt has appt with PCP in 4 days. Will refer pt to cardiology for possible echo. Stable for d/c. Return precautions given. Patient states understanding of treatment care plan and is agreeable.  Case discussed with attending Dr. Venora Maples who agrees with plan of care.   Illene Labrador, PA-C  05/18/14 0031 

## 2014-05-17 NOTE — ED Notes (Signed)
Patient c/o hypotension x 2 weeks. Patient states he was checking his blood pressure at work and getting readings of 60/45, 60/40, 57/40, 90/40. Patient states when he exerts himself he feels dizzy and his muscles feel funny. Patient states he saw PCP 3 weeks ago and was diagnosed borderline diabetic and has since adjusted his diet, but had no medication changes. Patient states he has "tried to see his Dr but has been unable to be seen", states he has an appointment on Wednesday. Patient states nothing has changed to bring him in tonight.

## 2014-05-17 NOTE — ED Notes (Signed)
Pt returned from xray

## 2014-05-17 NOTE — ED Notes (Signed)
If nurse is unavailable, tech will get blood draw

## 2014-05-18 MED ORDER — OLMESARTAN MEDOXOMIL 40 MG PO TABS: 40.0000 mg | ORAL_TABLET | Freq: Every day | ORAL | Status: DC

## 2014-05-18 NOTE — Discharge Instructions (Signed)
Stop taking Azor and bistolic until you see your primary care doctor. Begin taking olmesartan. Return with worsening symptoms.  Hypotension As your heart beats, it forces blood through your arteries. This force is your blood pressure. If your blood pressure is too low for you to go about your normal activities or to support the organs of your body, you have hypotension. Hypotension is also referred to as low blood pressure. When your blood pressure becomes too low, you may not get enough blood to your brain. As a result, you may feel weak, feel lightheaded, or develop a rapid heart rate. In a more severe case, you may faint. CAUSES Various conditions can cause hypotension. These include:  Blood loss.  Dehydration.  Heart or endocrine problems.  Pregnancy.  Severe infection.  Not having a well-balanced diet filled with needed nutrients.  Severe allergic reactions (anaphylaxis). Some medicines, such as blood pressure medicine or water pills (diuretics), may lower your blood pressure below normal. Sometimes taking too much medicine or taking medicine not as directed can cause hypotension. TREATMENT  Hospitalization is sometimes required for hypotension if fluid or blood replacement is needed, if time is needed for medicines to wear off, or if further monitoring is needed. Treatment might include changing your diet, changing your medicines (including medicines aimed at raising your blood pressure), and use of support stockings. HOME CARE INSTRUCTIONS   Drink enough fluids to keep your urine clear or pale yellow.  Take your medicines as directed by your health care provider.  Get up slowly from reclining or sitting positions. This gives your blood pressure a chance to adjust.  Wear support stockings as directed by your health care provider.  Maintain a healthy diet by including nutritious food, such as fruits, vegetables, nuts, whole grains, and lean meats. SEEK MEDICAL CARE IF:  You  have vomiting or diarrhea.  You have a fever for more than 2-3 days.  You feel more thirsty than usual.  You feel weak and tired. SEEK IMMEDIATE MEDICAL CARE IF:   You have chest pain or a fast or irregular heartbeat.  You have a loss of feeling in some part of your body, or you lose movement in your arms or legs.  You have trouble speaking.  You become sweaty or feel lightheaded.  You faint. MAKE SURE YOU:   Understand these instructions.  Will watch your condition.  Will get help right away if you are not doing well or get worse. Document Released: 10/17/2005 Document Revised: 08/07/2013 Document Reviewed: 04/19/2013 [facility] Patient Information 2015 [place], [place]. This information is not intended to replace advice given to you by your health care provider. Make sure you discuss any questions you have with your health care provider.

## 2014-05-18 NOTE — ED Provider Notes (Signed)
Medical screening examination/treatment/procedure(s) were performed by non-physician practitioner and as supervising physician I was immediately available for consultation/collaboration.   EKG Interpretation None          Hoy Morn, MD 05/18/14 0040

## 2014-05-30 ENCOUNTER — Encounter: Payer: Self-pay | Admitting: Cardiology

## 2014-05-30 ENCOUNTER — Ambulatory Visit (INDEPENDENT_AMBULATORY_CARE_PROVIDER_SITE_OTHER): Payer: Managed Care, Other (non HMO) | Admitting: Cardiology

## 2014-05-30 VITALS — BP 112/68 | HR 85 | Ht 72.0 in | Wt 184.0 lb

## 2014-05-30 DIAGNOSIS — I9589 Other hypotension: Secondary | ICD-10-CM

## 2014-05-30 DIAGNOSIS — I10 Essential (primary) hypertension: Secondary | ICD-10-CM

## 2014-05-30 NOTE — Progress Notes (Signed)
Clinical Summary Marc Garcia is a 57 y.o.male seen today as a new patient for the following medical problems.   1. Hypotension - recent ER visit 04/2014 with reported hypotension and dizziness. Reported home SBP in 60s? He was on antihypertensives at home. - in ER bp 105/62 with pulse 68 - bystolic and amlodopine were stopped, continued just on olmesartan.  - pcp stopped his olmesartan as well.  D-dimer 1.34, Cr 1.72 (baseline 0.96), BUN 28, K 4.4, Hgb 11.8, trop negative.  - EKG was done but not scanned into epic - CXR negative, CT PE negative  - reports intential 10 lbs weight loss in 3-4 weeks - lightheadeness has improved, feeling better off benicar. Recently made dietary changes due to borderline diabetes, cut back on carbs.  - denies any chest pain, no SOB, no DOE.  Past Medical History  Diagnosis Date  . Borderline diabetic   . Hypertension   . Hyperlipidemia      No Known Allergies   Current Outpatient Prescriptions  Medication Sig Dispense Refill  . amLODipine-olmesartan (AZOR) 5-40 MG per tablet Take 1 tablet by mouth daily.      Marland Kitchen aspirin EC 81 MG tablet Take 81 mg by mouth daily.      Marland Kitchen atorvastatin (LIPITOR) 10 MG tablet Take 10 mg by mouth daily.      Marland Kitchen etanercept (ENBREL) 50 MG/ML injection Inject 50 mg into the skin once a week. Friday      . folic acid (FOLVITE) 1 MG tablet Take 1 mg by mouth daily.      Marland Kitchen HYDROcodone-acetaminophen (NORCO/VICODIN) 5-325 MG per tablet Take 1 tablet by mouth every 6 (six) hours as needed for moderate pain.      . methotrexate (RHEUMATREX) 2.5 MG tablet Take 10 mg by mouth 2 (two) times a week. Friday & Saturday only. Caution:Chemotherapy. Protect from light.      . nebivolol (BYSTOLIC) 2.5 MG tablet Take 1 tablet (2.5 mg total) by mouth daily.  30 tablet  5  . olmesartan (BENICAR) 40 MG tablet Take 1 tablet (40 mg total) by mouth daily.  30 tablet  0  . pantoprazole (PROTONIX) 40 MG tablet Take 40 mg by mouth daily.        No current facility-administered medications for this visit.     Past Surgical History  Procedure Laterality Date  . Hernia repair    . Knee surgery Right   . Cystectomy       No Known Allergies    No family history on file.   Social History Marc Garcia reports that he has never smoked. He has never used smokeless tobacco. Marc Garcia reports that he does not drink alcohol.   Review of Systems CONSTITUTIONAL: No weight loss, fever, chills, weakness or fatigue.  HEENT: Eyes: No visual loss, blurred vision, double vision or yellow sclerae.No hearing loss, sneezing, congestion, runny nose or sore throat.  SKIN: No rash or itching.  CARDIOVASCULAR: per HPI RESPIRATORY: No shortness of breath, cough or sputum.  GASTROINTESTINAL: No anorexia, nausea, vomiting or diarrhea. No abdominal pain or blood.  GENITOURINARY: No burning on urination, no polyuria NEUROLOGICAL: No headache, dizziness, syncope, paralysis, ataxia, numbness or tingling in the extremities. No change in bowel or bladder control.  MUSCULOSKELETAL: No muscle, back pain, joint pain or stiffness.  LYMPHATICS: No enlarged nodes. No history of splenectomy.  PSYCHIATRIC: No history of depression or anxiety.  ENDOCRINOLOGIC: No reports of sweating, cold or heat intolerance. No  polyuria or polydipsia.  Marland Kitchen   Physical Examination p 85 bp 112/68 Wt 184 lbs BMI 25 Gen: resting comfortably, no acute distress HEENT: no scleral icterus, pupils equal round and reactive, no palptable cervical adenopathy,  CV: RRR, no m/r/g, no JVD, no carotid bruits Resp: Clear to auscultation bilaterally GI: abdomen is soft, non-tender, non-distended, normal bowel sounds, no hepatosplenomegaly MSK: extremities are warm, no edema.  Skin: warm, no rash Neuro:  no focal deficits Psych: appropriate affect   Diagnostic Studies 05/30/14 EKG NSR    Assessment and Plan  1. Hypotension - labs suggest he was hypovolemic. Low bp's likely  related to hypovolemia and home antihypertensives - symptoms improved off hypertensives. Less medication requirement may be related to recent weight loss.  - reports repeat labs, will request from pcp to see if AKI resolved - no indication for cardiac workup at this time. If symptoms return can reconsider at that time. Likely hypovolemia combined with too much bp meds, symptoms now resolved. Encouraged to maintain oral hydration.      Arnoldo Lenis, M.D.

## 2014-05-30 NOTE — Patient Instructions (Signed)
Your physician recommends that you schedule a follow-up appointment in: As Needed   Your physician recommends that you continue on your current medications as directed. Please refer to the Current Medication list given to you today.

## 2014-06-02 ENCOUNTER — Encounter: Payer: Self-pay | Admitting: Cardiology

## 2014-06-12 ENCOUNTER — Other Ambulatory Visit (HOSPITAL_COMMUNITY): Payer: Self-pay | Admitting: Rheumatology

## 2014-06-12 DIAGNOSIS — M25469 Effusion, unspecified knee: Secondary | ICD-10-CM

## 2014-06-12 DIAGNOSIS — M25561 Pain in right knee: Secondary | ICD-10-CM

## 2014-06-17 ENCOUNTER — Ambulatory Visit (HOSPITAL_COMMUNITY): Payer: Managed Care, Other (non HMO)

## 2014-06-20 ENCOUNTER — Ambulatory Visit (HOSPITAL_COMMUNITY)
Admission: RE | Admit: 2014-06-20 | Discharge: 2014-06-20 | Disposition: A | Discharge status: Home / Self Care | Payer: Managed Care, Other (non HMO) | Source: Ambulatory Visit | Attending: Rheumatology | Admitting: Rheumatology

## 2014-06-20 DIAGNOSIS — M25569 Pain in unspecified knee: Secondary | ICD-10-CM | POA: Diagnosis present

## 2014-06-20 DIAGNOSIS — M259 Joint disorder, unspecified: Secondary | ICD-10-CM | POA: Insufficient documentation

## 2014-06-20 DIAGNOSIS — M25669 Stiffness of unspecified knee, not elsewhere classified: Secondary | ICD-10-CM | POA: Insufficient documentation

## 2014-06-20 DIAGNOSIS — M25561 Pain in right knee: Secondary | ICD-10-CM

## 2014-06-20 DIAGNOSIS — M25469 Effusion, unspecified knee: Secondary | ICD-10-CM

## 2014-12-05 ENCOUNTER — Ambulatory Visit (HOSPITAL_COMMUNITY)
Admission: RE | Admit: 2014-12-05 | Discharge: 2014-12-05 | Disposition: A | Discharge status: Home / Self Care | Payer: Managed Care, Other (non HMO) | Source: Ambulatory Visit | Attending: Specialist | Admitting: Specialist

## 2014-12-05 DIAGNOSIS — R2689 Other abnormalities of gait and mobility: Secondary | ICD-10-CM | POA: Insufficient documentation

## 2014-12-05 DIAGNOSIS — R262 Difficulty in walking, not elsewhere classified: Secondary | ICD-10-CM

## 2014-12-05 DIAGNOSIS — M6281 Muscle weakness (generalized): Secondary | ICD-10-CM | POA: Diagnosis not present

## 2014-12-05 DIAGNOSIS — M25561 Pain in right knee: Secondary | ICD-10-CM | POA: Insufficient documentation

## 2014-12-05 DIAGNOSIS — M25661 Stiffness of right knee, not elsewhere classified: Secondary | ICD-10-CM | POA: Diagnosis not present

## 2014-12-05 DIAGNOSIS — R29898 Other symptoms and signs involving the musculoskeletal system: Secondary | ICD-10-CM

## 2014-12-05 DIAGNOSIS — Z9889 Other specified postprocedural states: Secondary | ICD-10-CM

## 2014-12-05 NOTE — Therapy (Signed)
[place], [name], [address] Phone: [phone]   Fax:  [phone]  Physical Therapy Evaluation  Patient Details  Name: Marc Garcia MRN: [id number] Date of Birth: 11/13/1955 Referring Provider:  Sydnee Cabal, MD  Encounter Date: 12/05/2014      PT End of Session - 12/05/14 1632    Visit Number 1   Number of Visits 12   Date for PT Re-Evaluation 01/04/15   Authorization Type [employer]   Authorization - Visit Number 1   Authorization - Number of Visits 12   PT Start Time 1600   PT Stop Time 8502   PT Time Calculation (min) 45 min   Activity Tolerance Patient tolerated treatment well   Behavior During Therapy [facility] for tasks assessed/performed      Past Medical History  Diagnosis Date  . Borderline diabetic   . Hypertension   . Hyperlipidemia     Past Surgical History  Procedure Laterality Date  . Hernia repair    . Knee surgery Right   . Cystectomy      There were no vitals taken for this visit.  Visit Diagnosis:  S/P right knee arthroscopy  Difficulty walking  Knee stiffness, right  Right knee pain  Right leg weakness      Subjective Assessment - 12/05/14 1557    Symptoms pain predominantly with weight bearing.    Pertinent History history of low back pain, no pain recently. 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  Patient had surgery to delay knee replacement. Patient expectly likely surger on Lt knee as well in the near future. WBing as toletrated. Patient instructed by MD to place asmuch wieght on Rt LE as tolerated.  Patient works at [employer]. History of torn cartilage in Rt knee priorto surgery 11 years ago.    Currently in Pain? Yes   Pain Score 1   sitting, 7 with weight bearing.    Pain Location Knee   Pain Orientation Right   Pain Descriptors / Indicators Sharp   Pain Type Surgical pain   Pain Onset In the past 7 days   Pain  Frequency Constant   Aggravating Factors  Weight bearing, bending.    Pain Relieving Factors rest, non-weigth bearing, straightening,           [facility] PT Assessment - 12/05/14 0001    Assessment   Medical Diagnosis Rt knee arthroplasty.    Onset Date 11/28/14   Next MD Visit Sydnee Cabal 01/05/15   Prior Therapy no   Precautions   Precaution Comments WBAT on Rt   Balance Screen   Has the patient fallen in the past 6 months No   Has the patient had a decrease in activity level because of a fear of falling?  No   Is the patient reluctant to leave their home because of a fear of falling?  No   Prior Function   Level of Independence Independent with basic ADLs   Observation/Other Assessments   Focus on Therapeutic Outcomes (FOTO)  67% limited   Functional Tests   Functional tests Sit to Stand;Other;Other2   Other:   Other/ Comments 3D hip excursions, limited ability to weight shift to Rt and Rotate to Rt   Other:   Other/Comments Stairs: difficulty going up adnd down has to use both hand rails.  Gait: limited ability to flex knee during gait.    Posture/Postural Control  Posture Comments WNL   AROM   Right Hip External Rotation  38   Right Hip Internal Rotation  26   Left Hip External Rotation  40   Left Hip Internal Rotation  34   Right Knee Extension -8   Right Knee Flexion 103   Right Ankle Dorsiflexion 6   Left Ankle Dorsiflexion 5   Strength   Right Hip Flexion 5/5   Left Hip Flexion 5/5   Right Knee Flexion 3+/5   Right Knee Extension 3+/5   Left Knee Flexion 5/5   Left Knee Extension 5/5   Right Ankle Dorsiflexion 5/5   Left Ankle Dorsiflexion 5/5                  [facility] Adult PT Treatment/Exercise - 12/05/14 0001    Knee/Hip Exercises: Stretches   Active Hamstring Stretch 3 reps;20 seconds   Active Hamstring Stretch Limitations 3 way to 12"    Hip Flexor Stretch 3 reps;20 seconds   Hip Flexor Stretch Limitations to 12"                  PT Education - 12/05/14 1958    Education provided Yes   Education Details HEP: hip flexor/knee drives and hamstring stretch   Person(s) Educated Patient   Methods Explanation;Demonstration;Handout   Comprehension Verbalized understanding;Returned demonstration          PT Short Term Goals - 12/05/14 1648    PT SHORT TERM GOAL #1   Title Patient will dmoenstrate increased knee flexion AROm to 0 degrees to be able to fully extend knee durign gait   Baseline -8   Time 3   Period Weeks   Status New   PT SHORT TERM GOAL #2   Title Patient will demsontrate increased knee flexion to 125 degrees to be bale to squat to reach to ankle for lifting.    Baseline 103 degrees   Time 3   Period Weeks   Status New   PT SHORT TERM GOAL #3   Title Patient will dmeostrate increased hamstring strength ot 4+/5 to be able to lunge to the floor and ambualte withtou assistive device   Time 3   Period Weeks   Status New   PT SHORT TERM GOAL #4   Title Patient will dmeostrate increased quadriceps strength ot 4+/5 to be able to ambulate up stairs with 1 HHA.    Time 3   Period Weeks   Status New   PT SHORT TERM GOAL #5   Title Patient will be indepent with HEp.    Time 3   Period Weeks   Status New           PT Long Term Goals - 12/05/14 1956    PT LONG TERM GOAL #1   Title Patient will be bale to squat through full depth to reach low boxes   Time 6   Period Weeks   Status New   PT LONG TERM GOAL #2   Title Patient will be able to lift 80lb from the floor to return to work.    Time 6   Period Weeks   Status New   PT LONG TERM GOAL #3   Title patient will be able to go up and down stairs without HHA.    Time 6   Period Weeks   Status New               Plan - 12/05/14 1639  Clinical Impression Statement Patient displays Rt knee stiffness and pain s/p Rt knee arthroplasty resulting in difficulty walking requiring bilateral crutches to improve gait tolerance. Patient  displays improvements in knee AROM following tibal grade 3 and 4 posterior-anterior and anterior to posterior mobilizations resulting in improved agait tolerance and weight bearing. Patient will benefit from skilled phsyical therapy to increase knee flexion and extension to normalize gait and increase strength so patient can lift 80lb from the floor. .    Pt will benefit from skilled therapeutic intervention in order to improve on the following deficits Abnormal gait;Decreased endurance;Impaired flexibility;Decreased strength;Decreased activity tolerance;Difficulty walking;Pain;Decreased range of motion   Rehab Potential Good   PT Frequency 3x / week   PT Duration 6 weeks   PT Treatment/Interventions Gait training;Stair training;Functional mobility training;Patient/family education;Passive range of motion;Manual techniques;Therapeutic exercise;Balance training   PT Next Visit Plan Introduce gastroc and ITband stretches, utilize functional manual reaction techniques to improve gait mechanics, piriformis stretch in supine. Manual techniques to increase knee flexion/extension, anterior knee drives.    PT Home Exercise Plan 3 way hamstring stretch and 3 way hip flexor stretch.    Consulted and Agree with Plan of Care Patient         Problem List Patient Active Problem List   Diagnosis Date Noted  . Lumbago 05/10/2011   Devona Konig PT DPT [place], [name], [address] Phone: [phone]   Fax:  [phone]

## 2014-12-08 ENCOUNTER — Ambulatory Visit (HOSPITAL_COMMUNITY)
Admission: RE | Admit: 2014-12-08 | Discharge: 2014-12-08 | Disposition: A | Discharge status: Home / Self Care | Payer: Managed Care, Other (non HMO) | Source: Ambulatory Visit | Attending: Specialist | Admitting: Specialist

## 2014-12-08 DIAGNOSIS — M25561 Pain in right knee: Secondary | ICD-10-CM

## 2014-12-08 DIAGNOSIS — M25661 Stiffness of right knee, not elsewhere classified: Secondary | ICD-10-CM

## 2014-12-08 DIAGNOSIS — R262 Difficulty in walking, not elsewhere classified: Secondary | ICD-10-CM

## 2014-12-08 DIAGNOSIS — Z9889 Other specified postprocedural states: Secondary | ICD-10-CM

## 2014-12-08 DIAGNOSIS — R29898 Other symptoms and signs involving the musculoskeletal system: Secondary | ICD-10-CM

## 2014-12-08 NOTE — Patient Instructions (Addendum)
Heel Slide   Bend knee and pull heel toward buttocks. Hold _1-2_ seconds. Return. Repeat 10-15 times. Do 2-3 sessions per day.  Quad Set   Slowly tighten thigh muscles, trying to flatten knee to the bed.  Hold for 3-5 seconds.  Repeat 10-15 times, 2-3 sessions per day.    Bridge   Lie back, legs bent to a comfortable position.  Keep body weight even between Rt and Lt leg.  Press through both legs and lift hips up off the bed.  Repeat 10-15 times. Do 1-2 sessions per day, 4 times per week.    Abduction    Lift leg up toward ceiling. Repeat 10-15 times. Do 1-2 sessions per day, 4 times per week.    Extension   Lift leg up in the air and bring it back down.  Repeat 10-15 times. Do 1-2 sessions per day, 4 times per week.

## 2014-12-08 NOTE — Therapy (Signed)
[place], [name], [address] Phone: [phone]   Fax:  [phone]  Physical Therapy Treatment  Patient Details  Name: KALIEL BOLDS MRN: [id number] Date of Birth: 1956/06/04 Referring Provider:  Sydnee Cabal, MD  Encounter Date: 12/08/2014      PT End of Session - 12/08/14 1151    Visit Number 2   Number of Visits 12   Date for PT Re-Evaluation 01/04/15   Authorization Type [employer]   Authorization - Visit Number 2   Authorization - Number of Visits 12      Past Medical History  Diagnosis Date  . Borderline diabetic   . Hypertension   . Hyperlipidemia     Past Surgical History  Procedure Laterality Date  . Hernia repair    . Knee surgery Right   . Cystectomy      There were no vitals taken for this visit.  Visit Diagnosis:  S/P right knee arthroscopy  Difficulty walking  Knee stiffness, right  Right knee pain  Right leg weakness      Subjective Assessment - 12/08/14 1100    Symptoms Pt presents to PT without AD today, reports he has been able to amb without device. No complaints of knee buckling or falls, though pain does increase to 8/10 with WB and flexion of the Rt knee.     Currently in Pain? Yes   Pain Score 0-No pain  8/10 with movement of the knee   Pain Location Knee   Pain Orientation Right          [facility] PT Assessment - 12/08/14 0001    Assessment   Medical Diagnosis Rt knee arthroplasty.    Onset Date 11/28/14   Next MD Visit Sydnee Cabal 01/05/15   AROM   Right Knee Extension -8  was -8   Right Knee Flexion 118  was 103   Left Knee Extension -1   Left Knee Flexion 132                  [facility] Adult PT Treatment/Exercise - 12/08/14 0001    Exercises   Exercises Knee/Hip   Knee/Hip Exercises: Stretches   Active Hamstring Stretch 3 reps;20 seconds   Active Hamstring Stretch Limitations 3 way to 12"    Hip Flexor Stretch 3 reps;20 seconds   Hip  Flexor Stretch Limitations 12" Step    Knee: Self-Stretch to increase Flexion 3 reps;10 seconds   Knee: Self-Stretch Limitations 12" Step between HS stretch   Piriformis Stretch 2 reps;30 seconds   Piriformis Stretch Limitations Seated   Gastroc Stretch 3 reps;30 seconds   Gastroc Stretch Limitations Slantboard   Knee/Hip Exercises: Standing   Heel Raises 10 reps  no UE   Knee Flexion 2 sets;10 reps;Right   Knee Flexion Limitations 1#   Forward Step Up 10 reps;Hand Hold: 1;Step Height: 4";Right   Gait Training VC for increasing foot clearance with increasing knee flexion, and heel/toe gait pattern though limited secodnary to decreased knee extension   Knee/Hip Exercises: Supine   Quad Sets 10 reps;Right   Quad Sets Limitations 3" hold with heel prop   Heel Slides 10 reps;Right   Heel Slides Limitations x5 AROM, x5 AAROM with rope   Bridges 10 reps   Bridges Limitations VC for equal pressure on Rt and Lt LE during bridge   Knee/Hip Exercises: Sidelying   Hip ABduction 2 sets;10 reps;Right   Hip ABduction Limitations 1#  Knee/Hip Exercises: Prone   Hip Extension 2 sets;10 reps;Right   Hip Extension Limitations 1#                PT Education - 12/08/14 1136    Education provided Yes   Education Details HEP : heel slide, quad set, bridge, SLR extension/abduction   Person(s) Educated Patient   Methods Explanation;Demonstration;Handout   Comprehension Verbalized understanding;Returned demonstration          PT Short Term Goals - 12/05/14 1648    PT SHORT TERM GOAL #1   Title Patient will dmoenstrate increased knee flexion AROm to 0 degrees to be able to fully extend knee durign gait   Baseline -8   Time 3   Period Weeks   Status New   PT SHORT TERM GOAL #2   Title Patient will demsontrate increased knee flexion to 125 degrees to be bale to squat to reach to ankle for lifting.    Baseline 103 degrees   Time 3   Period Weeks   Status New   PT SHORT TERM GOAL #3    Title Patient will dmeostrate increased hamstring strength ot 4+/5 to be able to lunge to the floor and ambualte withtou assistive device   Time 3   Period Weeks   Status New   PT SHORT TERM GOAL #4   Title Patient will dmeostrate increased quadriceps strength ot 4+/5 to be able to ambulate up stairs with 1 HHA.    Time 3   Period Weeks   Status New   PT SHORT TERM GOAL #5   Title Patient will be indepent with HEp.    Time 3   Period Weeks   Status New           PT Long Term Goals - 12/05/14 1956    PT LONG TERM GOAL #1   Title Patient will be bale to squat through full depth to reach low boxes   Time 6   Period Weeks   Status New   PT LONG TERM GOAL #2   Title Patient will be able to lift 80lb from the floor to return to work.    Time 6   Period Weeks   Status New   PT LONG TERM GOAL #3   Title patient will be able to go up and down stairs without HHA.    Time 6   Period Weeks   Status New               Plan - 12/08/14 1145    Clinical Impression Statement PT POC initiated, focusing on knee ROM and HEP for mat exercises. ROM assessed today with improvements noted in knee flexion, though none in extension noted; educated pt on importance of continueing HEP focusing of full end range of motion in both directions.  VC during gait in clinic and with exericses (knee flexion onto step or box for stretches) to decrease compensations and normalize movement patterns.  Educated pt to apply ice today after treatment session, as pt heading straight home and reports "i can feel it" by the end of the treatment session.     Pt will benefit from skilled therapeutic intervention in order to improve on the following deficits Abnormal gait;Decreased endurance;Impaired flexibility;Decreased strength;Decreased activity tolerance;Difficulty walking;Pain;Decreased range of motion   Rehab Potential Good   PT Frequency 3x / week   PT Duration 6 weeks   PT Treatment/Interventions Gait  training;Stair training;Functional mobility training;Patient/family education;Passive range of motion;Manual  techniques;Therapeutic exercise;Balance training   PT Next Visit Plan Introduce ITB stretch and progress piriformis stretch to supine if tolerated.  Add TKE for improving knee extension.  Manual techniques to increase knee flexion/extesnion, anterior knee drives.         Problem List Patient Active Problem List   Diagnosis Date Noted  . Lumbago 05/10/2011    Lonna Cobb, DPT [phone]  12/08/2014, 11:52 AM  [place], [name], [address] Phone: [phone]   Fax:  [phone]

## 2014-12-10 ENCOUNTER — Ambulatory Visit (HOSPITAL_COMMUNITY)
Admission: RE | Admit: 2014-12-10 | Discharge: 2014-12-10 | Disposition: A | Discharge status: Home / Self Care | Payer: Managed Care, Other (non HMO) | Source: Ambulatory Visit | Attending: Specialist | Admitting: Specialist

## 2014-12-10 DIAGNOSIS — Z9889 Other specified postprocedural states: Secondary | ICD-10-CM

## 2014-12-10 DIAGNOSIS — R29898 Other symptoms and signs involving the musculoskeletal system: Secondary | ICD-10-CM

## 2014-12-10 DIAGNOSIS — M25661 Stiffness of right knee, not elsewhere classified: Secondary | ICD-10-CM

## 2014-12-10 DIAGNOSIS — M25561 Pain in right knee: Secondary | ICD-10-CM

## 2014-12-10 DIAGNOSIS — R262 Difficulty in walking, not elsewhere classified: Secondary | ICD-10-CM

## 2014-12-10 NOTE — Therapy (Signed)
[place], [name], [address] Phone: [phone]   Fax:  [phone]  Physical Therapy Treatment  Patient Details  Name: AVION KUTZER MRN: [id number] Date of Birth: 1956/02/10 Referring Provider:  Sydnee Cabal, MD  Encounter Date: 12/10/2014      PT End of Session - 12/10/14 1129    Visit Number 3   Number of Visits 12   Date for PT Re-Evaluation 01/04/15   Authorization Type [employer]   Authorization Time Period Certification 25/95/6387-56/43/3295   Authorization - Visit Number 3   Authorization - Number of Visits 12   PT Start Time 1884   PT Stop Time 1153   PT Time Calculation (min) 50 min   Activity Tolerance Patient tolerated treatment well   Behavior During Therapy [facility] for tasks assessed/performed      Past Medical History  Diagnosis Date  . Borderline diabetic   . Hypertension   . Hyperlipidemia     Past Surgical History  Procedure Laterality Date  . Hernia repair    . Knee surgery Right   . Cystectomy      There were no vitals taken for this visit.  Visit Diagnosis:  S/P right knee arthroscopy  Difficulty walking  Knee stiffness, right  Right knee pain  Right leg weakness      Subjective Assessment - 12/10/14 1110    Symptoms Pt stated currently pain free while standing, reports increase pain with knee flexion and c/o knee popping while walking.  Compliant wth HEP dailym   Currently in Pain? No/denies          [facility] PT Assessment - 12/10/14 0001    Assessment   Medical Diagnosis Rt knee arthroplasty.    Onset Date 11/28/14   Next MD Visit Sydnee Cabal 01/05/15   Prior Therapy no   AROM   Right Knee Extension -4   Right Knee Flexion 122                  [facility] Adult PT Treatment/Exercise - 12/10/14 1132    Exercises   Exercises Knee/Hip   Knee/Hip Exercises: Stretches   Active Hamstring Stretch 3 reps;30 seconds   Active Hamstring Stretch Limitations 3  directions on 14in step   Quad Stretch 3 reps;30 seconds   Quad Stretch Limitations prone with rope   Knee: Self-Stretch to increase Flexion Limitations   Knee: Self-Stretch Limitations 3 direction knee drive   Piriformis Stretch 3 reps;30 seconds   Piriformis Stretch Limitations supine figure 4 with towel assistnace   Gastroc Stretch 3 reps;30 seconds   Gastroc Stretch Limitations Slantboard   Knee/Hip Exercises: Standing   Heel Raises 15 reps   Heel Raises Limitations Toe raises no HHA   Terminal Knee Extension Right;15 reps;Theraband   Theraband Level (Terminal Knee Extension) Level 3 (Green)   Gait Training 226 no AD with cueing for knee extensino, heel to toe gait pattern and equalized stance phase following patella mobs to see about "popping"   Knee/Hip Exercises: Supine   Quad Sets 10 reps;Right   Short Arc Quad Sets Right;15 reps   Terminal Knee Extension Right;15 reps   Straight Leg Raises Right;15 reps   Straight Leg Raises Limitations with TKE   Patellar Mobs Complete and instructed to pt and wife   Knee Extension PROM;3 sets   Knee Flexion PROM;1 set                PT Education -  12/10/14 1129    Education provided Yes   Education Details Instructed patella mobs all direcitons    Person(s) Educated Patient;Spouse   Methods Explanation;Demonstration;Tactile cues;Verbal cues   Comprehension Verbalized understanding;Returned demonstration          PT Short Term Goals - 12/10/14 1154    PT SHORT TERM GOAL #1   Title Patient will dmoenstrate increased knee flexion AROm to 0 degrees to be able to fully extend knee durign gait   Baseline 12/10/2014-4 degrees   Status On-going   PT SHORT TERM GOAL #2   Title Patient will demsontrate increased knee flexion to 125 degrees to be bale to squat to reach to ankle for lifting.    Baseline on 12/10/2014 122 degrees    Status On-going   PT SHORT TERM GOAL #3   Title Patient will dmeostrate increased hamstring  strength ot 4+/5 to be able to lunge to the floor and ambualte withtou assistive device   PT SHORT TERM GOAL #4   Title Patient will dmeostrate increased quadriceps strength ot 4+/5 to be able to ambulate up stairs with 1 HHA.    Status On-going   PT SHORT TERM GOAL #5   Title Patient will be indepent with HEp.    Status Achieved           PT Long Term Goals - 12/10/14 1154    PT LONG TERM GOAL #1   Title Patient will be bale to squat through full depth to reach low boxes   PT LONG TERM GOAL #2   Title Patient will be able to lift 80lb from the floor to return to work.    PT LONG TERM GOAL #3   Title patient will be able to go up and down stairs without HHA.                Plan - 12/10/14 1133    Clinical Impression Statement Session focus on improving ROM primarly extension, flexion secondary.  Added TKE exercises in standing, supine and prone to improve knee extension.  Min cueing to improve knee extension with gait and for equlaized stride length.  Added supine piriformis and ITBand stretches to improve hip mobilty.  Pt and wife instructed patella mobs following reports of "knee popping" during gait.  Pt stated popping reduced at end of session following patella mobs. AROM improved tto -4-122 at end of session following stretches, exercises and manual PROM. Pt stated slight increased in pain at end of session, pt encouraged to apply ice for pain and edema control.     PT Next Visit Plan Continue with current PT POC to improve TKE, once extension improves progress to functional strenghtening.        Problem List Patient Active Problem List   Diagnosis Date Noted  . Lumbago 05/10/2011   [name], [place]   [name] 12/10/2014, 11:58 AM  [place], [name], [address] Phone: [phone]   Fax:  [phone]

## 2014-12-12 ENCOUNTER — Encounter (HOSPITAL_COMMUNITY): Payer: Managed Care, Other (non HMO) | Admitting: Physical Therapy

## 2014-12-16 ENCOUNTER — Encounter (HOSPITAL_COMMUNITY): Payer: Managed Care, Other (non HMO) | Admitting: Physical Therapy

## 2014-12-17 ENCOUNTER — Ambulatory Visit (HOSPITAL_COMMUNITY): Payer: Managed Care, Other (non HMO)

## 2014-12-17 DIAGNOSIS — R29898 Other symptoms and signs involving the musculoskeletal system: Secondary | ICD-10-CM

## 2014-12-17 DIAGNOSIS — M25661 Stiffness of right knee, not elsewhere classified: Secondary | ICD-10-CM

## 2014-12-17 DIAGNOSIS — R262 Difficulty in walking, not elsewhere classified: Secondary | ICD-10-CM

## 2014-12-17 DIAGNOSIS — Z9889 Other specified postprocedural states: Secondary | ICD-10-CM

## 2014-12-17 DIAGNOSIS — M25561 Pain in right knee: Secondary | ICD-10-CM | POA: Diagnosis not present

## 2014-12-17 NOTE — Patient Instructions (Signed)
Knee Extension Mobilization: Hang (Prone)   With table supporting thighs, place ____ pound weight on right ankle. Hold 5 minutes and gradually increase as tolerated.  Do 1-2 sessions per day.  http://orth.exer.us/722   Copyright  VHI. All rights reserved.

## 2014-12-17 NOTE — Therapy (Signed)
[place], [name], [address] Phone: [phone]   Fax:  [phone]  Physical Therapy Treatment  Patient Details  Name: Marc Garcia MRN: [id number] Date of Birth: September 22, 1956 Referring Provider:  Sydnee Cabal, MD  Encounter Date: 12/17/2014      PT End of Session - 12/17/14 1141    Visit Number 4   Number of Visits 12   Date for PT Re-Evaluation 01/04/15   Authorization Type [employer]   Authorization Time Period Certification 32/20/2542-70/62/3762   Authorization - Visit Number 4   Authorization - Number of Visits 10   PT Start Time 1102   PT Stop Time 1145   PT Time Calculation (min) 43 min   Activity Tolerance Patient tolerated treatment well   Behavior During Therapy [facility] for tasks assessed/performed      Past Medical History  Diagnosis Date  . Borderline diabetic   . Hypertension   . Hyperlipidemia     Past Surgical History  Procedure Laterality Date  . Hernia repair    . Knee surgery Right   . Cystectomy      There were no vitals taken for this visit.  Visit Diagnosis:  S/P right knee arthroscopy  Difficulty walking  Knee stiffness, right  Right knee pain  Right leg weakness      Subjective Assessment - 12/17/14 1103    Symptoms Pain minimum today, stated increased pain yesterday due to weather.  Compliance with HEP daily.  Reports knee continues to pop while walking, wife has been completeing patella mobs at h9[facility].     Currently in Pain? Yes   Pain Score 2    Pain Location Knee   Pain Orientation Right   Pain Descriptors / Indicators Aching          [facility] PT Assessment - 12/17/14 0001    Assessment   Medical Diagnosis Rt knee arthroplasty.    Onset Date 11/28/14   Next MD Visit Sydnee Cabal 01/05/15   Prior Therapy no   AROM   Right Knee Extension -3   Right Knee Flexion 126                  [facility] Adult PT Treatment/Exercise - 12/17/14 1120    Exercises    Exercises Knee/Hip   Knee/Hip Exercises: Stretches   Active Hamstring Stretch 3 reps;30 seconds   Active Hamstring Stretch Limitations 3 directions on 14in step   Quad Stretch 3 reps;30 seconds   Quad Stretch Limitations prone with rope   Gastroc Stretch 3 reps;30 seconds   Gastroc Stretch Limitations Slantboard   Knee/Hip Exercises: Standing   Terminal Knee Extension Right;20 reps;Theraband   Theraband Level (Terminal Knee Extension) Level 4 (Blue)   Functional Squat 10 reps   Functional Squat Limitations 3D hip excursion   Knee/Hip Exercises: Supine   Short Arc Quad Sets Right;15 reps   Terminal Knee Extension Right;15 reps   Knee Extension PROM;3 sets   Knee/Hip Exercises: Prone   Hip Extension Right;10 reps   Prone Knee Hang 5 minutes   Prone Knee Hang Weights (lbs) manual MFR to hamstrings during knee hang   Other Prone Exercises TKE 10 x5"   Other Prone Exercises PROM for flexion x 1                  PT Short Term Goals - 12/17/14 1121    PT SHORT TERM GOAL #1   Title Patient will dmoenstrate  increased knee flexion AROm to 0 degrees to be able to fully extend knee durign gait   Status On-going   PT SHORT TERM GOAL #2   Title Patient will demsontrate increased knee flexion to 125 degrees to be bale to squat to reach to ankle for lifting.    Status Achieved   PT SHORT TERM GOAL #3   Title Patient will dmeostrate increased hamstring strength ot 4+/5 to be able to lunge to the floor and ambualte withtou assistive device   Status On-going   PT SHORT TERM GOAL #4   Title Patient will dmeostrate increased quadriceps strength ot 4+/5 to be able to ambulate up stairs with 1 HHA.    Status On-going   PT SHORT TERM GOAL #5   Title Patient will be indepent with HEp.    Status Achieved           PT Long Term Goals - 12/17/14 1122    PT LONG TERM GOAL #1   Title Patient will be bale to squat through full depth to reach low boxes   PT LONG TERM GOAL #2   Title  Patient will be able to lift 80lb from the floor to return to work.    PT LONG TERM GOAL #3   Title patient will be able to go up and down stairs without HHA.                Plan - 12/17/14 1141    Clinical Impression Statement Continued session focus on ROM primarily extension. Began prone knee hang with manual MFR to hamstring to improve knee extension.  AROM is improving, 3-126 today.  End of session added 3D hip excursion to improve hip mobiltiy and gluteal strengthening.     PT Next Visit Plan Continue with current PT POC to improve TKE, once extension improves progress to functional strenghtening.        Problem List Patient Active Problem List   Diagnosis Date Noted  . Lumbago 05/10/2011   [name], [place]  [name] 12/17/2014, 12:02 PM  [place] [address] [place], [name], [address] Phone: [phone]   Fax:  [phone]

## 2014-12-19 ENCOUNTER — Ambulatory Visit (HOSPITAL_COMMUNITY): Payer: Managed Care, Other (non HMO) | Admitting: Physical Therapy

## 2014-12-19 DIAGNOSIS — R262 Difficulty in walking, not elsewhere classified: Secondary | ICD-10-CM

## 2014-12-19 DIAGNOSIS — M25561 Pain in right knee: Secondary | ICD-10-CM

## 2014-12-19 DIAGNOSIS — R29898 Other symptoms and signs involving the musculoskeletal system: Secondary | ICD-10-CM

## 2014-12-19 DIAGNOSIS — Z9889 Other specified postprocedural states: Secondary | ICD-10-CM

## 2014-12-19 DIAGNOSIS — M25661 Stiffness of right knee, not elsewhere classified: Secondary | ICD-10-CM

## 2014-12-19 NOTE — Therapy (Signed)
[place], [name], [address] Phone: [phone]   Fax:  [phone]  Physical Therapy Treatment  Patient Details  Name: Marc Garcia MRN: [id number] Date of Birth: [birth year] Referring Provider:  Sydnee Cabal, MD  Encounter Date: 12/19/2014      PT End of Session - 12/19/14 1159    Visit Number 5   Number of Visits 12   Date for PT Re-Evaluation 01/04/15   Authorization Type [employer]   Authorization Time Period Certification 58/52/7782-42/35/3614   Authorization - Visit Number 5   Authorization - Number of Visits 10   PT Start Time 4315   PT Stop Time 1145   PT Time Calculation (min) 43 min   Activity Tolerance Patient tolerated treatment well   Behavior During Therapy [facility] for tasks assessed/performed      Past Medical History  Diagnosis Date  . Borderline diabetic   . Hypertension   . Hyperlipidemia     Past Surgical History  Procedure Laterality Date  . Hernia repair    . Knee surgery Right   . Cystectomy      There were no vitals taken for this visit.  Visit Diagnosis:  S/P right knee arthroscopy  Difficulty walking  Right knee pain  Knee stiffness, right  Right leg weakness      Subjective Assessment - 12/19/14 1109    Symptoms Pain is minimal, notes a little difficulty with swquatting to and from a chair.    Currently in Pain? Yes   Pain Score 1    Pain Location Knee   Pain Orientation Right              [facility] Adult PT Treatment/Exercise - 12/19/14 0001    Knee/Hip Exercises: Stretches   Active Hamstring Stretch Limitations 3 directions on 14in step 10x 3 seconds   Quad Stretch 3 reps;30 seconds   Hip Flexor Stretch Limitations 10x 3seconds  3 way to 14"    Gastroc Stretch 3 reps;30 seconds   Gastroc Stretch Limitations Slantboard   Knee/Hip Exercises: Standing   Heel Raises 10 reps;3 sets   Heel Raises Limitations Toe raises no HHA   Forward Lunges Limitations  8" box wth 3 way UE reach    Forward Step Up 10 reps;Step Height: 8";2 sets   Forward Step Up Limitations no UE assist   Functional Squat 10 reps   Functional Squat Limitations 3D split stance hip excursion   SLS Single leg anterior and lateral LE reach from 2" 10x            PT Short Term Goals - 12/19/14 1206    PT SHORT TERM GOAL #1   Title Patient will dmoenstrate increased knee flexion AROm to 0 degrees to be able to fully extend knee durign gait   Status On-going   PT SHORT TERM GOAL #2   Title Patient will demsontrate increased knee flexion to 125 degrees to be bale to squat to reach to ankle for lifting.    Status Achieved   PT SHORT TERM GOAL #3   Title Patient will demostrate increased hamstring strength ot 4+/5 to be able to lunge to the floor and ambualte withtou assistive device   Status On-going   PT SHORT TERM GOAL #4   Title Patient will dmeostrate increased quadriceps strength ot 4+/5 to be able to ambulate up stairs with 1 HHA.    Status On-going   PT SHORT TERM GOAL #5  Title Patient will be indepent with HEP.    Status Achieved           PT Long Term Goals - 12/19/14 1206    PT LONG TERM GOAL #1   Title Patient will be able to squat through full depth to reach low boxes   Status On-going   PT LONG TERM GOAL #2   Title Patient will be able to lift 80lb from the floor to return to work.    Status On-going   PT LONG TERM GOAL #3   Title patient will be able to go up and down stairs without HHA.    Status On-going               Plan - 12/19/14 1200    Clinical Impression Statement Lunges introduces and minisquats introduced to multiple positionms to bing intorducign improved loading mechanics in anticipation of patient's future return to work. patient cotninued to demonstrate limited quadriceps mobility.. patient cotninues to dmeonstrate hip internal rotation limitations secondary  to glute stiffness and ITBand.   PT Next Visit Plan Conitnue  lunges to 8" box, Progress single leg balance reaches to common matrix directions, and introduce Tensor fascia latea stretch.         Problem List Patient Active Problem List   Diagnosis Date Noted  . Lumbago 05/10/2011   Devona Konig PT DPT [place], [name], [address] Phone: [phone]   Fax:  [phone]

## 2014-12-23 ENCOUNTER — Encounter (HOSPITAL_COMMUNITY): Payer: Managed Care, Other (non HMO) | Admitting: Physical Therapy

## 2014-12-24 ENCOUNTER — Ambulatory Visit (HOSPITAL_COMMUNITY): Payer: Managed Care, Other (non HMO) | Admitting: Physical Therapy

## 2014-12-24 DIAGNOSIS — Z9889 Other specified postprocedural states: Secondary | ICD-10-CM

## 2014-12-24 DIAGNOSIS — R262 Difficulty in walking, not elsewhere classified: Secondary | ICD-10-CM

## 2014-12-24 DIAGNOSIS — M25661 Stiffness of right knee, not elsewhere classified: Secondary | ICD-10-CM

## 2014-12-24 DIAGNOSIS — R29898 Other symptoms and signs involving the musculoskeletal system: Secondary | ICD-10-CM

## 2014-12-24 DIAGNOSIS — M25561 Pain in right knee: Secondary | ICD-10-CM | POA: Diagnosis not present

## 2014-12-24 NOTE — Therapy (Signed)
[place], [name], [address] Phone: [phone]   Fax:  [phone]  Physical Therapy Treatment  Patient Details  Name: Marc Garcia MRN: [id number] Date of Birth: 30-Nov-1955 Referring Provider:  Sydnee Cabal, MD  Encounter Date: 12/24/2014      PT End of Session - 12/24/14 1149    Visit Number 6   Number of Visits 12   Date for PT Re-Evaluation 01/04/15   Authorization Type [employer]   Authorization Time Period Certification 13/24/4010-27/25/3664   Authorization - Visit Number 6   Authorization - Number of Visits 10   PT Start Time 1102   PT Stop Time 1145   PT Time Calculation (min) 43 min   Activity Tolerance Patient tolerated treatment well   Behavior During Therapy [facility] for tasks assessed/performed      Past Medical History  Diagnosis Date  . Borderline diabetic   . Hypertension   . Hyperlipidemia     Past Surgical History  Procedure Laterality Date  . Hernia repair    . Knee surgery Right   . Cystectomy      There were no vitals taken for this visit.  Visit Diagnosis:  S/P right knee arthroscopy  Difficulty walking  Right knee pain  Knee stiffness, right  Right leg weakness      Subjective Assessment - 12/24/14 1104    Symptoms Patient states that he is hurting all over, possibly because of the weather; also states that since last session his R knee has just not felt quite right.    Pertinent History history of low back pain, no pain recently. 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  Patient had surgery to delay knee replacement. Patient expectly likely surger on Lt knee as well in the near future. WBing as toletrated. Patient instructed by MD to place asmuch wieght on Rt LE as tolerated.  Patient works at [employer]. History of torn cartilage in Rt knee priorto surgery 11 years ago.                     [place] Adult PT  Treatment/Exercise - 12/24/14 0001    Ambulation/Gait   Gait Comments Stair training on 4" and 7" steps; mild hip hiking but no circumduction noted, mild pain R knee with stair navigation   Knee/Hip Exercises: Stretches   Active Hamstring Stretch 3 reps;30 seconds   Active Hamstring Stretch Limitations 3 directions on 14in step 10x 3 seconds   ITB Stretch 2 reps;30 seconds   ITB Stretch Limitations sidelying on table   Piriformis Stretch 2 reps;30 seconds   Piriformis Stretch Limitations seated   Gastroc Stretch 3 reps;30 seconds   Gastroc Stretch Limitations slantboard   Knee/Hip Exercises: Standing   Heel Raises 10 reps;1 set   Heel Raises Limitations off of slant board   Forward Lunges 1 set;10 reps;Both   Forward Lunges Limitations 8" box within pain free range due to general R knee soreness on this date   Side Lunges Both;1 set;10 reps   Side Lunges Limitations 8" box; cues for proper form and within painfree range due to knee soreness this   Functional Squat 1 set;10 reps   Functional Squat Limitations Mini-squats within pain free range, cues for proper form   Other Standing Knee Exercises 3D hip excursions with split stance (2 inch box under R leg to reduce pain with side to side excursions)  Other Standing Knee Exercises Facilitation of bilateral hip IR; heel to toe and SLS stances on foam pad, weakness hip stabilizers noted   Manual Therapy   Manual Therapy Joint mobilization   Joint Mobilization Grade 3 joint mobilizations for knee flexion and extension; gentle lateral and superior/inferior mobilization of R patellla                  PT Short Term Goals - 12/19/14 1206    PT SHORT TERM GOAL #1   Title Patient will dmoenstrate increased knee flexion AROm to 0 degrees to be able to fully extend knee durign gait   Status On-going   PT SHORT TERM GOAL #2   Title Patient will demsontrate increased knee flexion to 125 degrees to be bale to squat to reach to ankle for  lifting.    Status Achieved   PT SHORT TERM GOAL #3   Title Patient will demostrate increased hamstring strength ot 4+/5 to be able to lunge to the floor and ambualte withtou assistive device   Status On-going   PT SHORT TERM GOAL #4   Title Patient will dmeostrate increased quadriceps strength ot 4+/5 to be able to ambulate up stairs with 1 HHA.    Status On-going   PT SHORT TERM GOAL #5   Title Patient will be indepent with HEP.    Status Achieved           PT Long Term Goals - 12/19/14 1206    PT LONG TERM GOAL #1   Title Patient will be able to squat through full depth to reach low boxes   Status On-going   PT LONG TERM GOAL #2   Title Patient will be able to lift 80lb from the floor to return to work.    Status On-going   PT LONG TERM GOAL #3   Title patient will be able to go up and down stairs without HHA.    Status On-going               Plan - 12/24/14 1150    Clinical Impression Statement Patient arrived to session with 4/10 pain R knee and stated that the knee had "...just not been feeling right..." since last session. Today's session adjusted accordingly. Functional stretching performed with significant tightness noted bilateral IT bands; joint mobilizations performed to R knee and patella significantly limtied in superior/inferior directions. Performed cauttious lunges and squats within pain free range to avoid further aggravating R knee during session. Difficulty with SLS on foam pad, weakness of hip stabilizers noted.    Pt will benefit from skilled therapeutic intervention in order to improve on the following deficits Abnormal gait;Decreased endurance;Impaired flexibility;Decreased strength;Decreased activity tolerance;Difficulty walking;Pain;Decreased range of motion   Rehab Potential Good   PT Frequency 3x / week   PT Duration 6 weeks   PT Treatment/Interventions Gait training;Stair training;Functional mobility training;Patient/family education;Passive  range of motion;Manual techniques;Therapeutic exercise;Balance training   PT Next Visit Plan Perform lunges to 8" box and squats as tolerated; single leg balance reaches to common matrix and activities on foam surface; continue IT and Piriformis stretches   Consulted and Agree with Plan of Care Patient        Problem List Patient Active Problem List   Diagnosis Date Noted  . Lumbago 05/10/2011    Deniece Ree PT, DPT [place] [address] [place], [name], [address] Phone: [phone]   Fax:  [phone]

## 2014-12-26 ENCOUNTER — Ambulatory Visit (HOSPITAL_COMMUNITY): Payer: Managed Care, Other (non HMO) | Admitting: Physical Therapy

## 2014-12-26 DIAGNOSIS — R262 Difficulty in walking, not elsewhere classified: Secondary | ICD-10-CM

## 2014-12-26 DIAGNOSIS — M25561 Pain in right knee: Secondary | ICD-10-CM | POA: Diagnosis not present

## 2014-12-26 DIAGNOSIS — Z9889 Other specified postprocedural states: Secondary | ICD-10-CM

## 2014-12-26 DIAGNOSIS — R29898 Other symptoms and signs involving the musculoskeletal system: Secondary | ICD-10-CM

## 2014-12-26 DIAGNOSIS — M25661 Stiffness of right knee, not elsewhere classified: Secondary | ICD-10-CM

## 2014-12-26 NOTE — Therapy (Signed)
[place], [name], [address] Phone: [phone]   Fax:  [phone]  Physical Therapy Treatment  Patient Details  Name: DAXTEN KOVALENKO MRN: [id number] Date of Birth: 25-Aug-1956 Referring Provider:  Sydnee Cabal, MD  Encounter Date: 12/26/2014      PT End of Session - 12/26/14 1138    Visit Number 7   Number of Visits 12   Date for PT Re-Evaluation 01/04/15   Authorization Type [employer]   Authorization Time Period Certification 22/12/5425-04/23/7627   Authorization - Visit Number 7   Authorization - Number of Visits 10   PT Start Time 1050   PT Stop Time 1133   PT Time Calculation (min) 43 min   Activity Tolerance Patient tolerated treatment well   Behavior During Therapy [facility] for tasks assessed/performed      Past Medical History  Diagnosis Date  . Borderline diabetic   . Hypertension   . Hyperlipidemia     Past Surgical History  Procedure Laterality Date  . Hernia repair    . Knee surgery Right   . Cystectomy      There were no vitals taken for this visit.  Visit Diagnosis:  S/P right knee arthroscopy  Difficulty walking  Right knee pain  Knee stiffness, right  Right leg weakness      Subjective Assessment - 12/26/14 1050    Symptoms Patient states that he is feeling better today, pain around 2-3/10 R knee   Pertinent History history of low back pain, no pain recently. 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  Patient had surgery to delay knee replacement. Patient expectly likely surger on Lt knee as well in the near future. WBing as toletrated. Patient instructed by MD to place asmuch wieght on Rt LE as tolerated.  Patient works at [employer]. History of torn cartilage in Rt knee priorto surgery 11 years ago.    Currently in Pain? Yes   Pain Score 3    Pain Location Knee   Pain Orientation Right                    [facility] Adult  PT Treatment/Exercise - 12/26/14 0001    Knee/Hip Exercises: Stretches   Active Hamstring Stretch 3 reps;30 seconds   Active Hamstring Stretch Limitations on stairs   ITB Stretch 2 reps;30 seconds   ITB Stretch Limitations sidelying   Piriformis Stretch 2 reps;30 seconds   Piriformis Stretch Limitations seated   Gastroc Stretch 3 reps;30 seconds   Gastroc Stretch Limitations slantboard   Knee/Hip Exercises: Standing   Forward Lunges 1 set;15 reps;Both   Forward Lunges Limitations 8" box   Side Lunges 1 set;15 reps;Both   Side Lunges Limitations 8" box; cues for proper form and within painfree range due to knee soreness this   Functional Squat 10 reps   Functional Squat Limitations sit to stand   Other Standing Knee Exercises 3D hip excursions with split stance (2 inch box under R leg to reduce pain with side to side excursions)   Other Standing Knee Exercises Forward lunges onto BOSU ball   Knee/Hip Exercises: Sidelying   Hip ABduction Both;1 set;10 reps   Hip ABduction Limitations manual cues for proper performance   Knee/Hip Exercises: Prone   Other Prone Exercises Prone hip extensions 1x10   Manual Therapy   Manual Therapy Joint mobilization   Joint Mobilization Grade 3 joint mobilizations for knee  flexion and extension; gentle lateral and superior/inferior mobilization of R patella              Balance Exercises - 12/26/14 1123    Balance Exercises: Standing   Standing Eyes Opened Narrow base of support (BOS);30 secs  foam pad   Standing Eyes Closed Narrow base of support (BOS);30 secs  foam pad   Tandem Stance Eyes open;10 secs;Foam/compliant surface  x3 each side, no upper extremity support   SLS Eyes open;Foam/compliant surface;5 reps;10 secs           PT Education - 12/26/14 1137    Education provided Yes   Education Details Pt and family educated not to attempt hip ABD on floor due to difficulty getting up off of floor at home   Person(s) Educated Patient    Methods Explanation   Comprehension Verbalized understanding          PT Short Term Goals - 12/19/14 1206    PT SHORT TERM GOAL #1   Title Patient will dmoenstrate increased knee flexion AROm to 0 degrees to be able to fully extend knee durign gait   Status On-going   PT SHORT TERM GOAL #2   Title Patient will demsontrate increased knee flexion to 125 degrees to be bale to squat to reach to ankle for lifting.    Status Achieved   PT SHORT TERM GOAL #3   Title Patient will demostrate increased hamstring strength ot 4+/5 to be able to lunge to the floor and ambualte withtou assistive device   Status On-going   PT SHORT TERM GOAL #4   Title Patient will dmeostrate increased quadriceps strength ot 4+/5 to be able to ambulate up stairs with 1 HHA.    Status On-going   PT SHORT TERM GOAL #5   Title Patient will be indepent with HEP.    Status Achieved           PT Long Term Goals - 12/19/14 1206    PT LONG TERM GOAL #1   Title Patient will be able to squat through full depth to reach low boxes   Status On-going   PT LONG TERM GOAL #2   Title Patient will be able to lift 80lb from the floor to return to work.    Status On-going   PT LONG TERM GOAL #3   Title patient will be able to go up and down stairs without HHA.    Status On-going               Plan - 12/26/14 1138    Clinical Impression Statement Patient arrived to session with 2-3/10 pain R knee. Focus on functional strength and balance; noted significant weakness in hip ABD and extensor groups when performed from a gravity eliminated position. Difficulty with eccentric control of rocker board especially in anterior to posterior motion. Patella mobility appeared improved on this date.  Pain 2/10 at end of session.    Pt will benefit from skilled therapeutic intervention in order to improve on the following deficits Abnormal gait;Decreased endurance;Impaired flexibility;Decreased strength;Decreased activity  tolerance;Difficulty walking;Pain;Decreased range of motion   Rehab Potential Good   PT Frequency 3x / week   PT Duration 6 weeks   PT Treatment/Interventions Gait training;Stair training;Functional mobility training;Patient/family education;Passive range of motion;Manual techniques;Therapeutic exercise;Balance training   PT Next Visit Plan Trial 6" box for lunges; squats as tolerated; continue gravity present training for hip extensors and abductors; dynamic balance; continue IT and Piriformis stretches  Consulted and Agree with Plan of Care Patient        Problem List Patient Active Problem List   Diagnosis Date Noted  . Lumbago 05/10/2011    Deniece Ree PT, DPT [place] [address] [place], [name], [address] Phone: [phone]   Fax:  [phone]

## 2014-12-30 ENCOUNTER — Encounter (HOSPITAL_COMMUNITY): Payer: Managed Care, Other (non HMO) | Admitting: Physical Therapy

## 2014-12-31 ENCOUNTER — Ambulatory Visit (HOSPITAL_COMMUNITY): Payer: Managed Care, Other (non HMO) | Attending: Specialist

## 2014-12-31 DIAGNOSIS — R2689 Other abnormalities of gait and mobility: Secondary | ICD-10-CM | POA: Diagnosis not present

## 2014-12-31 DIAGNOSIS — R29898 Other symptoms and signs involving the musculoskeletal system: Secondary | ICD-10-CM

## 2014-12-31 DIAGNOSIS — M25661 Stiffness of right knee, not elsewhere classified: Secondary | ICD-10-CM | POA: Insufficient documentation

## 2014-12-31 DIAGNOSIS — Z9889 Other specified postprocedural states: Secondary | ICD-10-CM

## 2014-12-31 DIAGNOSIS — M25561 Pain in right knee: Secondary | ICD-10-CM | POA: Diagnosis not present

## 2014-12-31 DIAGNOSIS — M6281 Muscle weakness (generalized): Secondary | ICD-10-CM | POA: Diagnosis not present

## 2014-12-31 DIAGNOSIS — R262 Difficulty in walking, not elsewhere classified: Secondary | ICD-10-CM

## 2014-12-31 NOTE — Patient Instructions (Signed)
Knee Extension: Quad Set (Eccentric), (Resistance Band)   Stand holding support, tubing behind fully extended knee. Slowly bend knee for 3-5 seconds. Use blue resistance band. 10-20  reps per set, 1-2 sets per day, 3-5 days per week.  Copyright  VHI. All rights reserved.

## 2014-12-31 NOTE — Therapy (Signed)
[place], [name], [address] Phone: [phone]   Fax:  [phone]  Physical Therapy Treatment  Patient Details  Name: Marc Garcia MRN: [id number] Date of Birth: 01/04/1956 Referring Provider:  Sydnee Cabal, MD  Encounter Date: 12/31/2014      PT End of Session - 12/31/14 1120    Visit Number 8   Number of Visits 12   Date for PT Re-Evaluation 01/04/15   Authorization Type [employer]   Authorization Time Period Certification 58/07/9832-82/50/5397   Authorization - Visit Number 8   Authorization - Number of Visits 10   PT Start Time 1104   PT Stop Time 1154   PT Time Calculation (min) 50 min   Activity Tolerance Patient tolerated treatment well   Behavior During Therapy [facility] for tasks assessed/performed      Past Medical History  Diagnosis Date  . Borderline diabetic   . Hypertension   . Hyperlipidemia     Past Surgical History  Procedure Laterality Date  . Hernia repair    . Knee surgery Right   . Cystectomy      There were no vitals taken for this visit.  Visit Diagnosis:  S/P right knee arthroscopy  Difficulty walking  Right knee pain  Knee stiffness, right  Right leg weakness      Subjective Assessment - 12/31/14 1106    Symptoms Pt stated he has tried walking about half mile daily, takes about 30 minutes to complete.  Pt stated pan continues on lateral aspect Rt knee   Currently in Pain? Yes   Pain Score 4    Pain Location Knee   Pain Orientation Right;Lateral   Pain Descriptors / Indicators Patsi Sears PT Assessment - 12/31/14 1121    Assessment   Medical Diagnosis Rt knee arthroplasty.    Onset Date 11/28/14   Next MD Visit Sydnee Cabal 01/05/15   Prior Therapy no                  [facility] Adult PT Treatment/Exercise - 12/31/14 1113    Exercises   Exercises Knee/Hip   Knee/Hip Exercises: Stretches   Active Hamstring Stretch 3 reps;30 seconds   Active Hamstring Stretch Limitations 14in step 3 directions   Quad Stretch 3 reps;30 seconds   Quad Stretch Limitations prone with rope   ITB Stretch 2 reps;30 seconds   ITB Stretch Limitations standing beside 6in step   Piriformis Stretch 3 reps;30 seconds   Piriformis Stretch Limitations seated   Gastroc Stretch 3 reps;30 seconds   Gastroc Stretch Limitations slantboard   Knee/Hip Exercises: Standing   Forward Lunges Both;15 reps   Forward Lunges Limitations 6in box   Side Lunges 1 set;15 reps;Both   Side Lunges Limitations 6in box with manual facilitation for proper form and technique   Terminal Knee Extension Right;20 reps;Theraband   Theraband Level (Terminal Knee Extension) Level 4 (Blue)   Terminal Knee Extension Limitations HEP   Lateral Step Up Right;10 reps;Hand Hold: 1;Step Height: 4"   Forward Step Up Right;15 reps;Hand Hold: 0;Step Height: 8"   Functional Squat 2 sets;10 reps;15 reps   Functional Squat Limitations 3D split stance Rt forward   SLS Lt 52", Rt 20" max of 5   Gait Training emphasis on heel to toe and equal stride length/stance phase   Other Standing Knee Exercises 3D hip excursions with split stance (2 inch box under R  leg to reduce pain with side to side excursions)   Other Standing Knee Exercises Sidestepping with greentband 2RT; Tandem gait 2RT   Knee/Hip Exercises: Sidelying   Hip ABduction Both;15 reps   Hip ABduction Limitations manual cues for proper form   Knee/Hip Exercises: Prone   Hip Extension Right;2 sets;10 reps   Other Prone Exercises TKE 10 x5"                PT Education - 12/31/14 1140    Education provided Yes   Education Details Standing TKE HEP, blue tband given   Person(s) Educated Patient   Methods Explanation;Demonstration;Handout   Comprehension Verbalized understanding;Returned demonstration          PT Short Term Goals - 12/31/14 1118    PT SHORT TERM GOAL #1   Title Patient will dmoenstrate increased knee  flexion AROm to 0 degrees to be able to fully extend knee durign gait   Status On-going   PT SHORT TERM GOAL #2   Title Patient will demsontrate increased knee flexion to 125 degrees to be bale to squat to reach to ankle for lifting.    Status Achieved   PT SHORT TERM GOAL #3   Title Patient will demostrate increased hamstring strength ot 4+/5 to be able to lunge to the floor and ambualte withtou assistive device   Status On-going   PT SHORT TERM GOAL #4   Title Patient will dmeostrate increased quadriceps strength ot 4+/5 to be able to ambulate up stairs with 1 HHA.    PT SHORT TERM GOAL #5   Title Patient will be indepent with HEP.            PT Long Term Goals - 12/31/14 1119    PT LONG TERM GOAL #1   Title Patient will be able to squat through full depth to reach low boxes   Status On-going   PT LONG TERM GOAL #2   Title Patient will be able to lift 80lb from the floor to return to work.    Status On-going   PT LONG TERM GOAL #3   Title patient will be able to go up and down stairs without HHA.    Status On-going               Plan - 12/31/14 1123    Clinical Impression Statement Began session with gait traning and education on importance of proper heel to toe gait and equalized stance phase and stride length to improve gait mechanics for pain control.  Resumed TKE exercises to improve knee extension with gait.  Pt given blue tband  to begin standing TKE at home.  Pt reported pain reduced following gait training and stretches to improve flexibilty with improved depth noted with squats following quad stretch.  Progress gluteal strenghtening with reduced height with lunges this session for increased loading with therapist facilitation to improve loading and resumed stair training.  Progressed to tandem gait with CGA with ability to regain balance independtly.  No reports of increased pain through session.   PT Next Visit Plan Reassess next session prior MD apt 01/05/2015.   Continue 6in box for lunges, continue gravity present training for hip extension and abductors;  Dynamic balance continue IT and piriformis stretches.  Pt left without HEP printout for standing TKS, please provide next session.          Problem List Patient Active Problem List   Diagnosis Date Noted  . Lumbago 05/10/2011  [name], [place]  [name] 12/31/2014, 12:06 PM  [place], [name], [address] Phone: [phone]   Fax:  [phone]

## 2015-01-02 ENCOUNTER — Ambulatory Visit (HOSPITAL_COMMUNITY): Payer: Managed Care, Other (non HMO) | Admitting: Physical Therapy

## 2015-01-02 DIAGNOSIS — M25561 Pain in right knee: Secondary | ICD-10-CM

## 2015-01-02 DIAGNOSIS — Z9889 Other specified postprocedural states: Secondary | ICD-10-CM

## 2015-01-02 DIAGNOSIS — R262 Difficulty in walking, not elsewhere classified: Secondary | ICD-10-CM

## 2015-01-02 DIAGNOSIS — M25661 Stiffness of right knee, not elsewhere classified: Secondary | ICD-10-CM

## 2015-01-02 DIAGNOSIS — R29898 Other symptoms and signs involving the musculoskeletal system: Secondary | ICD-10-CM

## 2015-01-02 NOTE — Therapy (Signed)
[place], [name], [address] Phone: [phone]   Fax:  [phone]  Physical Therapy Treatment  Patient Details  Name: Marc Garcia MRN: [id number] Date of Birth: Oct 23, 1956 Referring Provider:  Sydnee Cabal, MD  Encounter Date: 01/02/2015      PT End of Session - 01/02/15 1027    Visit Number 9   Number of Visits 20   Date for PT Re-Evaluation 01/04/15   Authorization Type [employer]   Authorization Time Period Certification 38/93/7342-87/68/1157   Authorization - Visit Number 9   Authorization - Number of Visits 20   PT Start Time 1017   PT Stop Time 1100   PT Time Calculation (min) 43 min   Activity Tolerance Patient tolerated treatment well   Behavior During Therapy [facility] for tasks assessed/performed      Past Medical History  Diagnosis Date  . Borderline diabetic   . Hypertension   . Hyperlipidemia     Past Surgical History  Procedure Laterality Date  . Hernia repair    . Knee surgery Right   . Cystectomy      There were no vitals taken for this visit.  Visit Diagnosis:  S/P right knee arthroscopy  Difficulty walking  Right knee pain  Knee stiffness, right  Right leg weakness      Subjective Assessment - 01/02/15 1020    Symptoms patient states his knee is a little sore after walking 1 mile. Patient notes over all he feels he is getting better but ciontinues to have soreness/pain on lateral knee.     Currently in Pain? Yes   Pain Score 3    Pain Location Knee   Pain Orientation Right;Lateral          [facility] PT Assessment - 01/02/15 0001    Assessment   Medical Diagnosis Rt knee arthroplasty.    Onset Date 11/28/14   Next MD Visit Sydnee Cabal 01/05/15   Prior Therapy no   Observation/Other Assessments   Focus on Therapeutic Outcomes (FOTO)  43% limited was 67%   AROM   Right Hip External Rotation  45   Right Hip Internal Rotation  22   Left Hip External Rotation  45   Left Hip Internal Rotation  40   Right Knee Extension -3   Right Knee Flexion 133   Left Knee Extension -1   Left Knee Flexion 132   Strength   Right Knee Flexion 4-/5   Right Knee Extension 4-/5                  [facility] Adult PT Treatment/Exercise - 01/02/15 0001    Knee/Hip Exercises: Stretches   Active Hamstring Stretch Limitations 10x 3" to 12"  3 ways   Quad Stretch 3 reps;30 seconds   Quad Stretch Limitations prone with rope   ITB Stretch Limitations standing beside 6in step 10x 3"    Piriformis Stretch 3 reps;30 seconds   Piriformis Stretch Limitations seated   Gastroc Stretch Limitations standing 10x 3 seconds 3 way   Knee/Hip Exercises: Standing   Forward Lunges Both;15 reps   Forward Lunges Limitations 6in box, also 20x with same side rotation   Side Lunges 1 set;15 reps;Both   Side Lunges Limitations 6in box with manual facilitation for proper form and technique   Functional Squat 2 sets;10 reps;15 reps   Functional Squat Limitations 3D split stance Rt forward   Other Standing Knee Exercises 3D split stance hip  excursions 10x   Other Standing Knee Exercises Sidestepping with greentband 2RT; Tandem gait 2RT                  PT Short Term Goals - 01/02/15 1118    PT SHORT TERM GOAL #1   Title Patient will dmoenstrate increased knee flexion AROm to 0 degrees to be able to fully extend knee durign gait   Status On-going   PT SHORT TERM GOAL #2   Title Patient will demsontrate increased knee flexion to 125 degrees to be bale to squat to reach to ankle for lifting.    Status Achieved   PT SHORT TERM GOAL #3   Title Patient will demostrate increased hamstring strength ot 4+/5 to be able to lunge to the floor and ambualte withtou assistive device   Status On-going   PT SHORT TERM GOAL #4   Title Patient will dmeostrate increased quadriceps strength ot 4+/5 to be able to ambulate up stairs with 1 HHA.    Status On-going   PT SHORT TERM GOAL #5   Title  Patient will be indepent with HEP.    Status Achieved           PT Long Term Goals - 01/02/15 1118    PT LONG TERM GOAL #1   Title Patient will be able to squat through full depth to reach low boxes   Status On-going   PT LONG TERM GOAL #2   Title Patient will be able to lift 80lb from the floor to return to work.    Status On-going   PT LONG TERM GOAL #3   Title patient will be able to go up and down stairs without HHA.    Status On-going               Plan - 01/02/15 1103    Clinical Impression Statement Patient appears to be making good progress in ROm and strength though strength still limited secondary to lateral knee pain atrtributed to tensor fascia latae, lateral quad, piriformis and lateral hamstring limited ROM. Patient gait s much improvd followign squat walk arounds and manual therapy to improve Rt knee extension and hip internal rotation.  Physical therapuy to continue 2x a week for 4 more weeks to reach remaining goals and prepare patient to return to work.    PT Next Visit Plan  Continue 6in box for lunges with same side rotation and cuing to increase hip internal rotation , Continue functional strength training to prepare patient to return to work. ; continue IT and piriformis stretches and manual to increase hip internal rotation  and knee terminal extension.         Problem List Patient Active Problem List   Diagnosis Date Noted  . Lumbago 05/10/2011    Marc Garcia R 01/02/2015, 11:24 AM  [place] [address] [place], [name], [address] Phone: [phone]   Fax:  [phone]

## 2015-01-13 ENCOUNTER — Ambulatory Visit (HOSPITAL_COMMUNITY): Payer: Managed Care, Other (non HMO)

## 2015-01-13 DIAGNOSIS — M25661 Stiffness of right knee, not elsewhere classified: Secondary | ICD-10-CM

## 2015-01-13 DIAGNOSIS — R262 Difficulty in walking, not elsewhere classified: Secondary | ICD-10-CM

## 2015-01-13 DIAGNOSIS — Z9889 Other specified postprocedural states: Secondary | ICD-10-CM

## 2015-01-13 DIAGNOSIS — M25561 Pain in right knee: Secondary | ICD-10-CM

## 2015-01-13 DIAGNOSIS — R29898 Other symptoms and signs involving the musculoskeletal system: Secondary | ICD-10-CM

## 2015-01-13 NOTE — Therapy (Signed)
[place], [name], [address] Phone: [phone]   Fax:  [phone]  Physical Therapy Treatment  Patient Details  Name: Marc Garcia MRN: [id number] Date of Birth: 27-Oct-1956 Referring Provider:  Sydnee Cabal, MD  Encounter Date: 01/13/2015      PT End of Session - 01/13/15 1126    Visit Number 10   Number of Visits 20   Date for PT Re-Evaluation 02/03/15   Authorization Type [employer]   Authorization Time Period Certification 17/40/8144-81/85/6314   Authorization - Visit Number 10   Authorization - Number of Visits 20   PT Start Time 1100   PT Stop Time 1145   PT Time Calculation (min) 45 min   Activity Tolerance Patient tolerated treatment well   Behavior During Therapy [facility] for tasks assessed/performed      Past Medical History  Diagnosis Date  . Borderline diabetic   . Hypertension   . Hyperlipidemia     Past Surgical History  Procedure Laterality Date  . Hernia repair    . Knee surgery Right   . Cystectomy      There were no vitals filed for this visit.  Visit Diagnosis:  S/P right knee arthroscopy  Difficulty walking  Right knee pain  Knee stiffness, right  Right leg weakness      Subjective Assessment - 01/13/15 1101    Symptoms Pt stated he continues to be limited by pain, feels a knee replacement might be on call   Currently in Pain? Yes   Pain Score 4    Pain Location Knee   Pain Orientation Right;Anterior   Pain Descriptors / Indicators Hervey Ard            [facility] PT Assessment - 01/13/15 0001    Assessment   Medical Diagnosis Rt knee arthroplasty.    Onset Date 11/28/14   Next MD Visit Sydnee Cabal 02/16/2015   Prior Therapy no                   [facility] Adult PT Treatment/Exercise - 01/13/15 1107    Exercises   Exercises Knee/Hip   Knee/Hip Exercises: Stretches   Active Hamstring Stretch 3 reps;30 seconds   Active Hamstring Stretch Limitations 14in  step 3 sets lateral hamstring, 1 set medial hamstirng   Quad Stretch 3 reps;30 seconds   Quad Stretch Limitations prone with rope with manual for rectus femoris   ITB Stretch 3 reps;30 seconds   ITB Stretch Limitations standing beside 6in step    Piriformis Stretch 4 reps;30 seconds   Piriformis Stretch Limitations seated   Gastroc Stretch 3 reps;30 seconds   Gastroc Stretch Limitations slantboard   Knee/Hip Exercises: Standing   Heel Raises Limitations   Heel Raises Limitations Heel walking 2RT   Forward Lunges Both;20 reps   Forward Lunges Limitations 6in box with same side rotation   Side Lunges 1 set;15 reps;Both   Side Lunges Limitations 6in box with manual facilitation for proper form and technique   Functional Squat 2 sets;10 reps   Functional Squat Limitations 3D excursion split stance Rt forward; 2 squats then walk around step for IR   Other Standing Knee Exercises Sidestepping with greentband 2RT   Knee/Hip Exercises: Seated   Other Seated Knee Exercises Heel roll outs 10x10" with towel between knees   Knee/Hip Exercises: Prone   Other Prone Exercises TKE 15 x5"  PT Short Term Goals - 01/13/15 1143    PT SHORT TERM GOAL #1   Title Patient will dmoenstrate increased knee flexion AROm to 0 degrees to be able to fully extend knee durign gait   Status On-going   PT SHORT TERM GOAL #2   Title Patient will demsontrate increased knee flexion to 125 degrees to be bale to squat to reach to ankle for lifting.    Status Achieved   PT SHORT TERM GOAL #3   Title Patient will demostrate increased hamstring strength ot 4+/5 to be able to lunge to the floor and ambualte withtou assistive device   Status On-going   PT SHORT TERM GOAL #4   Title Patient will dmeostrate increased quadriceps strength ot 4+/5 to be able to ambulate up stairs with 1 HHA.    Status On-going   PT SHORT TERM GOAL #5   Title Patient will be indepent with HEP.    Status Achieved            PT Long Term Goals - 01/13/15 1143    PT LONG TERM GOAL #1   Title Patient will be able to squat through full depth to reach low boxes   Status On-going   PT LONG TERM GOAL #2   Title Patient will be able to lift 80lb from the floor to return to work.    PT LONG TERM GOAL #3   Title patient will be able to go up and down stairs without HHA.                Plan - 01/13/15 1127    Clinical Impression Statement Session focus on improving internal rotation to  improve loading with gait for pain control.  Conttinued with stretches and excursion exercises to improve hip mobility.  Added seated heel slide outs to improve IR with noted increased  ease crossing legs following.  No reports of increased pain through session.   PT Next Visit Plan  Continue 6in box for lunges with same side rotation and cuing to increase hip internal rotation , Continue functional strength training to prepare patient to return to work. ; continue IT and piriformis stretches and manual to increase hip internal rotation  and knee terminal extension.         Problem List Patient Active Problem List   Diagnosis Date Noted  . Lumbago 05/10/2011   [name], [place]  [name] 01/13/2015, 11:45 AM  [place], [name], [address] Phone: [phone]   Fax:  [phone]

## 2015-01-16 ENCOUNTER — Ambulatory Visit (HOSPITAL_COMMUNITY): Payer: Managed Care, Other (non HMO) | Admitting: Physical Therapy

## 2015-01-16 DIAGNOSIS — M25661 Stiffness of right knee, not elsewhere classified: Secondary | ICD-10-CM

## 2015-01-16 DIAGNOSIS — M25561 Pain in right knee: Secondary | ICD-10-CM

## 2015-01-16 DIAGNOSIS — R29898 Other symptoms and signs involving the musculoskeletal system: Secondary | ICD-10-CM

## 2015-01-16 DIAGNOSIS — R262 Difficulty in walking, not elsewhere classified: Secondary | ICD-10-CM

## 2015-01-16 DIAGNOSIS — Z9889 Other specified postprocedural states: Secondary | ICD-10-CM

## 2015-01-16 NOTE — Therapy (Signed)
[place], [name], [address] Phone: [phone]   Fax:  [phone]  Physical Therapy Treatment  Patient Details  Name: Marc Garcia MRN: [id number] Date of Birth: [birth year] Referring Provider:  Sydnee Cabal, MD  Encounter Date: 01/16/2015      PT End of Session - 01/16/15 1013    Visit Number 11   Number of Visits 20   Date for PT Re-Evaluation 02/03/15   Authorization Type [employer]   Authorization Time Period Certification 65/46/5035-46/56/8127   Authorization - Visit Number 11   Authorization - Number of Visits 20   PT Start Time 0931   PT Stop Time 1014   PT Time Calculation (min) 43 min   Activity Tolerance Patient tolerated treatment well   Behavior During Therapy [facility] for tasks assessed/performed      Past Medical History  Diagnosis Date  . Borderline diabetic   . Hypertension   . Hyperlipidemia     Past Surgical History  Procedure Laterality Date  . Hernia repair    . Knee surgery Right   . Cystectomy      There were no vitals filed for this visit.  Visit Diagnosis:  S/P right knee arthroscopy  Difficulty walking  Right knee pain  Knee stiffness, right  Right leg weakness      Subjective Assessment - 01/16/15 0937    Symptoms patient states mild frutration with progress notes mst difficulty with stiffness in knee upon getting up in the mornign and getting out of bed in the night.    Currently in Pain? Yes   Pain Score 2    Pain Location Knee   Pain Orientation Right                       [facility] Adult PT Treatment/Exercise - 01/16/15 0001    Knee/Hip Exercises: Stretches   Active Hamstring Stretch 3 reps;30 seconds   Active Hamstring Stretch Limitations 14in step 3 sets lateral hamstring,   Quad Stretch 3 reps;30 seconds   Quad Stretch Limitations prone with rope with manual for rectus femoris   Hip Flexor Stretch Limitations 14 inch with same side  rotation    ITB Stretch 3 reps;30 seconds   ITB Stretch Limitations standing beside 6in step    Piriformis Stretch 4 reps;30 seconds   Piriformis Stretch Limitations seated   Gastroc Stretch 2 reps;20 seconds   Gastroc Stretch Limitations slant board toes in   Knee/Hip Exercises: Standing   Forward Lunges Both;20 reps   Forward Lunges Limitations 6in box with same side rotation   Side Lunges 1 set;15 reps;Both   Side Lunges Limitations 6in box with manual facilitation for proper form and technique, no rotation.    Functional Squat 2 sets;10 reps   Functional Squat Limitations 2D walking hip Excursions, 84ft, squat walk arounds 10x to 6" box   Manual Therapy   Manual Therapy Joint mobilization   Joint Mobilization Grade 3 joint mobilizations for knee flexion and extension; gentle lateral and superior/inferior mobilization of R patella                   PT Short Term Goals - 01/13/15 1143    PT SHORT TERM GOAL #1   Title Patient will dmoenstrate increased knee flexion AROm to 0 degrees to be able to fully extend knee durign gait   Status On-going   PT SHORT TERM GOAL #2   Title Patient  will demsontrate increased knee flexion to 125 degrees to be bale to squat to reach to ankle for lifting.    Status Achieved   PT SHORT TERM GOAL #3   Title Patient will demostrate increased hamstring strength ot 4+/5 to be able to lunge to the floor and ambualte withtou assistive device   Status On-going   PT SHORT TERM GOAL #4   Title Patient will dmeostrate increased quadriceps strength ot 4+/5 to be able to ambulate up stairs with 1 HHA.    Status On-going   PT SHORT TERM GOAL #5   Title Patient will be indepent with HEP.    Status Achieved           PT Long Term Goals - 01/13/15 1143    PT LONG TERM GOAL #1   Title Patient will be able to squat through full depth to reach low boxes   Status On-going   PT LONG TERM GOAL #2   Title Patient will be able to lift 80lb from the  floor to return to work.    PT LONG TERM GOAL #3   Title patient will be able to go up and down stairs without HHA.                Plan - 01/16/15 1014    Clinical Impression Statement Session focused on improvign transition zone 1 of gait by increasing Rt hip internal rotation, flexion and shock absorbtion of gait. patient noted no pain at end of session in knee. cues required for proper loadign during lunges.    PT Next Visit Plan Maintain current program for exercises and manual therapy to anhance proper adaptation.         Problem List Patient Active Problem List   Diagnosis Date Noted  . Lumbago 05/10/2011    Cavon Nicolls R 01/16/2015, 10:16 AM  [place], [name], [address] Phone: [phone]   Fax:  [phone]

## 2015-01-20 ENCOUNTER — Ambulatory Visit (HOSPITAL_COMMUNITY): Payer: Managed Care, Other (non HMO) | Admitting: Physical Therapy

## 2015-01-20 DIAGNOSIS — Z9889 Other specified postprocedural states: Secondary | ICD-10-CM

## 2015-01-20 DIAGNOSIS — M25561 Pain in right knee: Secondary | ICD-10-CM | POA: Diagnosis not present

## 2015-01-20 DIAGNOSIS — M25661 Stiffness of right knee, not elsewhere classified: Secondary | ICD-10-CM

## 2015-01-20 DIAGNOSIS — R262 Difficulty in walking, not elsewhere classified: Secondary | ICD-10-CM

## 2015-01-20 DIAGNOSIS — R29898 Other symptoms and signs involving the musculoskeletal system: Secondary | ICD-10-CM

## 2015-01-20 NOTE — Therapy (Signed)
[place], [name], [address] Phone: [phone]   Fax:  [phone]  Physical Therapy Treatment  Patient Details  Name: Marc Garcia MRN: [id number] Date of Birth: 1956/07/01 Referring Provider:  Sydnee Cabal, MD  Encounter Date: 01/20/2015      PT End of Session - 01/20/15 0928    Visit Number 12   Number of Visits 20   Date for PT Re-Evaluation 02/03/15   Authorization Type [employer]   Authorization Time Period Certification 76/19/5093-26/71/2458   Authorization - Visit Number 12   Authorization - Number of Visits 20   PT Start Time 0930   PT Stop Time 1015   PT Time Calculation (min) 45 min   Activity Tolerance Patient tolerated treatment well   Behavior During Therapy [facility] for tasks assessed/performed      Past Medical History  Diagnosis Date  . Borderline diabetic   . Hypertension   . Hyperlipidemia     Past Surgical History  Procedure Laterality Date  . Hernia repair    . Knee surgery Right   . Cystectomy      There were no vitals filed for this visit.  Visit Diagnosis:  S/P right knee arthroscopy  Difficulty walking  Right knee pain  Knee stiffness, right  Right leg weakness                     [facility] Adult PT Treatment/Exercise - 01/20/15 0001    Knee/Hip Exercises: Stretches   Active Hamstring Stretch 3 reps;30 seconds   Active Hamstring Stretch Limitations 14in step 3 sets lateral hamstring,   Quad Stretch 3 reps;30 seconds   Quad Stretch Limitations prone with rope with manual for rectus femoris   Hip Flexor Stretch Limitations 14 inch with same side rotation    ITB Stretch 30 seconds;2 reps   ITB Stretch Limitations standing beside 6in step    Piriformis Stretch 4 reps;30 seconds   Piriformis Stretch Limitations seated   Gastroc Stretch 2 reps;20 seconds   Gastroc Stretch Limitations with eversion focus.    Knee/Hip Exercises: Standing   Forward Lunges  Both;2 sets;10 reps   Forward Lunges Limitations 6in box with same side rotation   Side Lunges 1 set;Both;10 reps   Side Lunges Limitations 6in box with manual facilitation for proper form and technique, no rotation.    Functional Squat Limitations  squat walk arounds 10x to 6" box   Other Standing Knee Exercises 2D walking hip Excursions, 76ft,   Other Standing Knee Exercises Sidestepping with greentband 2RT   Manual Therapy   Manual Therapy Joint mobilization   Joint Mobilization Grade 3 joint mobilizations for knee flexion and extension; gentle lateral and superior/inferior mobilization of R patella                   PT Short Term Goals - 01/13/15 1143    PT SHORT TERM GOAL #1   Title Patient will dmoenstrate increased knee flexion AROm to 0 degrees to be able to fully extend knee durign gait   Status On-going   PT SHORT TERM GOAL #2   Title Patient will demsontrate increased knee flexion to 125 degrees to be bale to squat to reach to ankle for lifting.    Status Achieved   PT SHORT TERM GOAL #3   Title Patient will demostrate increased hamstring strength ot 4+/5 to be able to lunge to the floor and ambualte withtou assistive device  Status On-going   PT SHORT TERM GOAL #4   Title Patient will dmeostrate increased quadriceps strength ot 4+/5 to be able to ambulate up stairs with 1 HHA.    Status On-going   PT SHORT TERM GOAL #5   Title Patient will be indepent with HEP.    Status Achieved           PT Long Term Goals - 01/13/15 1143    PT LONG TERM GOAL #1   Title Patient will be able to squat through full depth to reach low boxes   Status On-going   PT LONG TERM GOAL #2   Title Patient will be able to lift 80lb from the floor to return to work.    PT LONG TERM GOAL #3   Title patient will be able to go up and down stairs without HHA.                Plan - 01/20/15 0955    Clinical Impression Statement transition zone 1 of gait mechanincs maintain  improvements from last session thoguh knee pain continues minimally attributed to still limited transition zone 2 mechnaics with limited hip internal rotation resulting in excessive varus moment and limited ability to torate throughtou pelvis and femur. Noted continued stiffness in hips preventing further improvement in hip internal rotation.    PT Next Visit Plan Maintain current program for exercises and manual therapy to aenhance proper adaptation to improved transition zone 1 and 2 phases of gait. Utilize hip  Internal rotation joint mobilizations and MET to increase ROM.         Problem List Patient Active Problem List   Diagnosis Date Noted  . Lumbago 05/10/2011    Karter Haire R 01/20/2015, 10:12 AM  [place], [name], [address] Phone: [phone]   Fax:  [phone]

## 2015-01-23 ENCOUNTER — Ambulatory Visit (HOSPITAL_COMMUNITY): Payer: Managed Care, Other (non HMO)

## 2015-01-23 DIAGNOSIS — M25561 Pain in right knee: Secondary | ICD-10-CM

## 2015-01-23 DIAGNOSIS — M25661 Stiffness of right knee, not elsewhere classified: Secondary | ICD-10-CM

## 2015-01-23 DIAGNOSIS — Z9889 Other specified postprocedural states: Secondary | ICD-10-CM

## 2015-01-23 DIAGNOSIS — R29898 Other symptoms and signs involving the musculoskeletal system: Secondary | ICD-10-CM

## 2015-01-23 DIAGNOSIS — R262 Difficulty in walking, not elsewhere classified: Secondary | ICD-10-CM

## 2015-01-23 NOTE — Therapy (Signed)
[place], [name], [address] Phone: [phone]   Fax:  [phone]  Physical Therapy Treatment  Patient Details  Name: Marc Garcia MRN: [id number] Date of Birth: [birth year] Referring Provider:  Sydnee Cabal, MD  Encounter Date: 01/23/2015      PT End of Session - 01/23/15 1009    Visit Number 13   Number of Visits 20   Date for PT Re-Evaluation 02/03/15   Authorization Type [employer]   Authorization Time Period Certification 11/08/3233-57/32/2025   Authorization - Visit Number 13   Authorization - Number of Visits 20   PT Start Time 0930   PT Stop Time 1016   PT Time Calculation (min) 46 min   Activity Tolerance Patient tolerated treatment well   Behavior During Therapy [facility] for tasks assessed/performed      Past Medical History  Diagnosis Date  . Borderline diabetic   . Hypertension   . Hyperlipidemia     Past Surgical History  Procedure Laterality Date  . Hernia repair    . Knee surgery Right   . Cystectomy      There were no vitals filed for this visit.  Visit Diagnosis:  S/P right knee arthroscopy  Difficulty walking  Right knee pain  Knee stiffness, right  Right leg weakness      Subjective Assessment - 01/23/15 1003    Symptoms Pt stated he did a lot of exercises, walking and stretches yesterday with incresed soreness following, current pain scale in Rt hip and knee 2/10   Currently in Pain? Yes   Pain Score 2    Pain Location Leg   Pain Orientation Right   Pain Descriptors / Indicators Aching;Hervey Ard            [facility] PT Assessment - 01/23/15 0001    Assessment   Medical Diagnosis Rt knee arthroplasty.    Onset Date 11/28/14   Next MD Visit Sydnee Cabal 02/16/2015   Prior Therapy no                   [facility] Adult PT Treatment/Exercise - 01/23/15 0001    Exercises   Exercises Knee/Hip   Knee/Hip Exercises: Stretches   Active Hamstring Stretch 3  reps;30 seconds   Active Hamstring Stretch Limitations 14in step 3 sets lateral hamstring,   ITB Stretch 30 seconds;2 reps   ITB Stretch Limitations standing beside 6in step    Piriformis Stretch 4 reps;30 seconds   Piriformis Stretch Limitations seated   Gastroc Stretch 3 reps;30 seconds   Gastroc Stretch Limitations with eversion focus.    Knee/Hip Exercises: Standing   Forward Lunges Both;2 sets;10 reps   Forward Lunges Limitations 6in box with same side rotation   Side Lunges 1 set;Both;10 reps   Side Lunges Limitations 6in box with manual facilitation for proper form and technique, no rotation.    Functional Squat 15 reps   Other Standing Knee Exercises 2D walking hip Excursions, 53ft,   Manual Therapy   Manual Therapy Other (comment)   Joint Mobilization Muscle energy technique for Rt SI anterior rotation and pubic clearing to improve alilgnment f/b gait training to improve transition zone 1                  PT Short Term Goals - 01/23/15 1022    PT SHORT TERM GOAL #1   Title Patient will dmoenstrate increased knee flexion AROm to 0 degrees to be able to fully  extend knee durign gait   Status On-going   PT SHORT TERM GOAL #2   Status Achieved   PT SHORT TERM GOAL #3   Title Patient will demostrate increased hamstring strength ot 4+/5 to be able to lunge to the floor and ambualte withtou assistive device   Status On-going   PT SHORT TERM GOAL #4   Title Patient will dmeostrate increased quadriceps strength ot 4+/5 to be able to ambulate up stairs with 1 HHA.    Status On-going   PT SHORT TERM GOAL #5   Title Patient will be indepent with HEP.    Status Achieved           PT Long Term Goals - 01/23/15 1024    PT LONG TERM GOAL #1   Title Patient will be able to squat through full depth to reach low boxes   Status On-going   PT LONG TERM GOAL #2   Title Patient will be able to lift 80lb from the floor to return to work.    Status On-going   PT LONG TERM  GOAL #3   Title patient will be able to go up and down stairs without HHA.    Status On-going               Plan - 01/23/15 1011    Clinical Impression Statement Session focus on improving gait mechanics with foot placement on transition zone 1 and 2 of gait.  Pt c/o increased hip pain with rotation.  Muscle energy techniques complete for Rt SI anterior and pubic clearing to improve hip internal rotatoin. Pt able to demonstrate improvion internal rotation with knee with gait mechanics at end of session.     PT Next Visit Plan Maintain current program for exercises and manual therapy to aenhance proper adaptation to improved transition zone 1 and 2 phases of gait. .         Problem List Patient Active Problem List   Diagnosis Date Noted  . Lumbago 05/10/2011   [name], [place]  [name] 01/23/2015, 10:25 AM  [place], [name], [address] Phone: [phone]   Fax:  [phone]

## 2015-01-27 ENCOUNTER — Ambulatory Visit (HOSPITAL_COMMUNITY): Payer: Managed Care, Other (non HMO) | Admitting: Physical Therapy

## 2015-01-27 DIAGNOSIS — R29898 Other symptoms and signs involving the musculoskeletal system: Secondary | ICD-10-CM

## 2015-01-27 DIAGNOSIS — Z9889 Other specified postprocedural states: Secondary | ICD-10-CM

## 2015-01-27 DIAGNOSIS — R262 Difficulty in walking, not elsewhere classified: Secondary | ICD-10-CM

## 2015-01-27 DIAGNOSIS — M25661 Stiffness of right knee, not elsewhere classified: Secondary | ICD-10-CM

## 2015-01-27 DIAGNOSIS — M25561 Pain in right knee: Secondary | ICD-10-CM | POA: Diagnosis not present

## 2015-01-27 NOTE — Therapy (Signed)
[place], [name], [address] Phone: [phone]   Fax:  [phone]  Physical Therapy Treatment  Patient Details  Name: Marc Garcia MRN: [id number] Date of Birth: 1956-05-24 Referring Provider:  Sydnee Cabal, MD  Encounter Date: 01/27/2015      PT End of Session - 01/27/15 0936    Visit Number 14   Number of Visits 20   Date for PT Re-Evaluation 02/03/15   Authorization Type [employer]   Authorization Time Period Certification 61/44/3154-00/86/7619   Authorization - Visit Number 14   Authorization - Number of Visits 20   PT Start Time 0930   PT Stop Time 1015   PT Time Calculation (min) 45 min   Activity Tolerance Patient tolerated treatment well   Behavior During Therapy [facility] for tasks assessed/performed      Past Medical History  Diagnosis Date  . Borderline diabetic   . Hypertension   . Hyperlipidemia     Past Surgical History  Procedure Laterality Date  . Hernia repair    . Knee surgery Right   . Cystectomy      There were no vitals filed for this visit.  Visit Diagnosis:  S/P right knee arthroscopy  Difficulty walking  Right knee pain  Knee stiffness, right  Right leg weakness      Subjective Assessment - 01/27/15 0930    Symptoms Patient states increased lateral hip pain bilaterally. notes being uncomfortable when trying to sleep.                        [place] Adult PT Treatment/Exercise - 01/27/15 0001    Knee/Hip Exercises: Stretches   Active Hamstring Stretch 3 reps;30 seconds   Active Hamstring Stretch Limitations 14in step 3 sets lateral hamstring,   Quad Stretch 3 reps;30 seconds   Quad Stretch Limitations prone with rope with manual for rectus femoris   Hip Flexor Stretch Limitations 3 way hip drives to 14" box with overhead reach 5x each   ITB Stretch 30 seconds;2 reps   ITB Stretch Limitations standing beside 6in step    Piriformis Stretch 4 reps;30  seconds   Piriformis Stretch Limitations seated   Gastroc Stretch 3 reps;30 seconds   Gastroc Stretch Limitations with eversion focus.    Knee/Hip Exercises: Standing   Forward Lunges Both;2 sets;10 reps   Forward Lunges Limitations 6in box with same side rotation   Side Lunges 1 set;Both;10 reps   Side Lunges Limitations 4in box with manual facilitation for proper form and technique, no rotation. knee high reach    Terminal Knee Extension 20 reps   Theraband Level (Terminal Knee Extension) Level 4 (Blue)   Step Down Step Height: 2";Hand Hold: 1;10 reps   Functional Squat Limitations  squat walk arounds 10x to 6" box   Other Standing Knee Exercises 2D walking hip Excursions, 19ft,   Other Standing Knee Exercises Static lunge to floor with functional manual reactions to improve gait mechanics. 2x20 each.    Manual Therapy   Manual Therapy Joint mobilization   Joint Mobilization Grade 3 joint mobilizations for knee flexion and extension; gentle lateral and superior/inferior mobilization of R patella                   PT Short Term Goals - 01/23/15 1022    PT SHORT TERM GOAL #1   Title Patient will dmoenstrate increased knee flexion AROm to 0 degrees to be  able to fully extend knee durign gait   Status On-going   PT SHORT TERM GOAL #2   Status Achieved   PT SHORT TERM GOAL #3   Title Patient will demostrate increased hamstring strength ot 4+/5 to be able to lunge to the floor and ambualte withtou assistive device   Status On-going   PT SHORT TERM GOAL #4   Title Patient will dmeostrate increased quadriceps strength ot 4+/5 to be able to ambulate up stairs with 1 HHA.    Status On-going   PT SHORT TERM GOAL #5   Title Patient will be indepent with HEP.    Status Achieved           PT Long Term Goals - 01/23/15 1024    PT LONG TERM GOAL #1   Title Patient will be able to squat through full depth to reach low boxes   Status On-going   PT LONG TERM GOAL #2   Title  Patient will be able to lift 80lb from the floor to return to work.    Status On-going   PT LONG TERM GOAL #3   Title patient will be able to go up and down stairs without HHA.    Status On-going               Plan - 01/27/15 0956    Clinical Impression Statement Session intiially focused on decreasing hip pain/stiffness then conitnued focus on improving gait mechanics with foot placement on transition zone 1 and 2 of gait. Pt c/o increased hip pain with rotation. Muscle energy techniques complete for Rt SI anterior and pubic clearing to improve hip internal rotatoin. Pt able to demonstrate improvion internal rotation with knee with gait mechanics at end of session.    PT Next Visit Plan Maintain current program for exercises and manual therapy to enhance proper adaptation to improved transition zone 1 and 2 phases of gait increasing hip internal rotation and knee extension.         Problem List Patient Active Problem List   Diagnosis Date Noted  . Lumbago 05/10/2011   Devona Konig PT DPT [place], [name], [address] Phone: [phone]   Fax:  [phone]

## 2015-01-29 ENCOUNTER — Ambulatory Visit (HOSPITAL_COMMUNITY): Payer: Managed Care, Other (non HMO) | Admitting: Physical Therapy

## 2015-01-29 DIAGNOSIS — M25561 Pain in right knee: Secondary | ICD-10-CM | POA: Diagnosis not present

## 2015-01-29 DIAGNOSIS — M25661 Stiffness of right knee, not elsewhere classified: Secondary | ICD-10-CM

## 2015-01-29 DIAGNOSIS — R262 Difficulty in walking, not elsewhere classified: Secondary | ICD-10-CM

## 2015-01-29 DIAGNOSIS — R29898 Other symptoms and signs involving the musculoskeletal system: Secondary | ICD-10-CM

## 2015-01-29 DIAGNOSIS — Z9889 Other specified postprocedural states: Secondary | ICD-10-CM

## 2015-01-29 NOTE — Therapy (Signed)
[place], [name], [address] Phone: [phone]   Fax:  [phone]  Physical Therapy Treatment  Patient Details  Name: Marc Garcia MRN: [id number] Date of Birth: 1956/03/25 Referring Provider:  Sydnee Cabal, MD  Encounter Date: 01/29/2015      PT End of Session - 01/29/15 0948    Visit Number 15   Number of Visits 20   Date for PT Re-Evaluation 02/03/15   Authorization Type [employer]   Authorization Time Period Certification 62/22/9798-92/08/9416   Authorization - Visit Number 15   Authorization - Number of Visits 20   PT Start Time 0850   PT Stop Time 0935   PT Time Calculation (min) 45 min   Activity Tolerance Patient tolerated treatment well   Behavior During Therapy [facility] for tasks assessed/performed      Past Medical History  Diagnosis Date  . Borderline diabetic   . Hypertension   . Hyperlipidemia     Past Surgical History  Procedure Laterality Date  . Hernia repair    . Knee surgery Right   . Cystectomy      There were no vitals filed for this visit.  Visit Diagnosis:  S/P right knee arthroscopy  Difficulty walking  Right knee pain  Knee stiffness, right  Right leg weakness      Subjective Assessment - 01/29/15 0907    Symptoms Patient arrives noting continued back and Rt knee stiffness/pain. no pain noted at end of therapy.    Currently in Pain? No/denies            [facility] Adult PT Treatment/Exercise - 01/29/15 0001    Knee/Hip Exercises: Stretches   Active Hamstring Stretch Limitations 10x 3' 3 way   Hip Flexor Stretch Limitations 3 way hip drives to 14" box with 10x 3"    Piriformis Stretch 4 reps;30 seconds   Piriformis Stretch Limitations seated   Gastroc Stretch Limitations 10x 10 seconds   Knee/Hip Exercises: Standing   Forward Step Up Step Height: 4";10 reps   Step Down Step Height: 2";Hand Hold: 1;10 reps   Functional Squat Limitations  squat walk arounds  10x to 6" box   Other Standing Knee Exercises 3D hip excursions 10x, split stane 3D hip excursions 10x, single elg toe touch 3D hip excursions 10x   Knee/Hip Exercises: Supine   Terminal Knee Extension Theraband;2 sets;Strengthening;AAROM;Right   Manual Therapy   Manual Therapy Joint mobilization   Joint Mobilization Grade 3 joint mobilizations for knee flexion and extension; gentle lateral and superior/inferior mobilization of R patella, hip internal rotation           PT Short Term Goals - 01/23/15 1022    PT SHORT TERM GOAL #1   Title Patient will dmoenstrate increased knee flexion AROm to 0 degrees to be able to fully extend knee durign gait   Status On-going   PT SHORT TERM GOAL #2   Status Achieved   PT SHORT TERM GOAL #3   Title Patient will demostrate increased hamstring strength ot 4+/5 to be able to lunge to the floor and ambualte withtou assistive device   Status On-going   PT SHORT TERM GOAL #4   Title Patient will dmeostrate increased quadriceps strength ot 4+/5 to be able to ambulate up stairs with 1 HHA.    Status On-going   PT SHORT TERM GOAL #5   Title Patient will be indepent with HEP.    Status Achieved  PT Long Term Goals - 01/23/15 1024    PT LONG TERM GOAL #1   Title Patient will be able to squat through full depth to reach low boxes   Status On-going   PT LONG TERM GOAL #2   Title Patient will be able to lift 80lb from the floor to return to work.    Status On-going   PT LONG TERM GOAL #3   Title patient will be able to go up and down stairs without HHA.    Status On-going               Plan - 01/29/15 0949    Clinical Impression Statement Session focused on increasing gait efficiency by increasing knee extension and bilateral hip internal rotation as well as ankle eversion to improve loading and unloading mechanics of gait. Patient demosntrated still limited knee extension to -3 degrees from full extension. Pain improved at end  of session, noted back pain improvement following Rt hip flexion mobilizations.    PT Next Visit Plan re-integrate lunges into therapy session and contienud focus on increasign transverse plane motion into ankle and hips. educate patient on 3D dowel pendulems to promte increased thoraicc spine rotation to decrease strain on knee for returnign to golf.        Problem List Patient Active Problem List   Diagnosis Date Noted  . Lumbago 05/10/2011   Devona Konig PT DPT [place], [name], [address] Phone: [phone]   Fax:  [phone]

## 2015-02-03 ENCOUNTER — Encounter (HOSPITAL_COMMUNITY): Payer: Managed Care, Other (non HMO)

## 2015-02-04 ENCOUNTER — Ambulatory Visit (HOSPITAL_COMMUNITY): Payer: Managed Care, Other (non HMO) | Attending: Specialist | Admitting: Physical Therapy

## 2015-02-04 DIAGNOSIS — M6281 Muscle weakness (generalized): Secondary | ICD-10-CM | POA: Diagnosis not present

## 2015-02-04 DIAGNOSIS — Z9889 Other specified postprocedural states: Secondary | ICD-10-CM

## 2015-02-04 DIAGNOSIS — M25661 Stiffness of right knee, not elsewhere classified: Secondary | ICD-10-CM | POA: Diagnosis not present

## 2015-02-04 DIAGNOSIS — R2689 Other abnormalities of gait and mobility: Secondary | ICD-10-CM | POA: Insufficient documentation

## 2015-02-04 DIAGNOSIS — R262 Difficulty in walking, not elsewhere classified: Secondary | ICD-10-CM

## 2015-02-04 DIAGNOSIS — M25561 Pain in right knee: Secondary | ICD-10-CM

## 2015-02-04 DIAGNOSIS — R29898 Other symptoms and signs involving the musculoskeletal system: Secondary | ICD-10-CM

## 2015-02-04 NOTE — Therapy (Signed)
Marc Garcia, [name], [address] Phone: [phone]   Fax:  [phone]  Physical Therapy Evaluation  Patient Details  Name: Marc Garcia MRN: [id number] Date of Birth: [birth year] Referring Provider:  Sydnee Cabal, MD  Encounter Date: 02/04/2015      PT End of Session - 02/04/15 1520    Visit Number 16   Number of Visits 20   Date for PT Re-Evaluation 02/03/15   Authorization Type [employer]   Authorization Time Period Certification 94/80/1655-37/48/2707   Authorization - Visit Number 16   Authorization - Number of Visits 20   PT Start Time 8675   PT Stop Time 1515   PT Time Calculation (min) 42 min   Activity Tolerance Patient tolerated treatment well   Behavior During Therapy [facility] for tasks assessed/performed      Past Medical History  Diagnosis Date  . Borderline diabetic   . Hypertension   . Hyperlipidemia     Past Surgical History  Procedure Laterality Date  . Hernia repair    . Knee surgery Right   . Cystectomy      There were no vitals filed for this visit.  Visit Diagnosis:  S/P right knee arthroscopy - Plan: PT plan of care cert/re-cert  Difficulty walking - Plan: PT plan of care cert/re-cert  Right knee pain - Plan: PT plan of care cert/re-cert  Knee stiffness, right - Plan: PT plan of care cert/re-cert  Right leg weakness - Plan: PT plan of care cert/re-cert      Subjective Assessment - 02/04/15 1434    Subjective Patient notes having had a couple tough days with both of his knees giving way. noting both legs gave way on him yester day following prolonged standing.    Currently in Pain? Yes   Pain Score 2    Pain Location Knee   Pain Orientation Right;Left            [facility] PT Assessment - 02/04/15 0001    Assessment   Medical Diagnosis Rt knee arthroplasty.    Onset Date 11/28/14   Next MD Visit Sydnee Cabal 02/16/2015   Prior Therapy no   AROM   Right Hip External  Rotation  45   Right Hip Internal Rotation  22   Left Hip External Rotation  45   Left Hip Internal Rotation  40   Right Knee Extension -3   Right Knee Flexion 133   Left Knee Extension -1   Left Knee Flexion 132   Strength   Right Knee Flexion 4/5   Right Knee Extension 4/5            [facility] Adult PT Treatment/Exercise - 02/04/15 0001    Knee/Hip Exercises: Stretches   Active Hamstring Stretch Limitations 10x 3' 3 way to 14"    Quad Stretch 3 reps;30 seconds   Quad Stretch Limitations prone with rope with manual for rectus femoris   Hip Flexor Stretch Limitations 3 way hip drives to 14" box with 10x 3"    Piriformis Stretch 4 reps;30 seconds   Piriformis Stretch Limitations seated   Gastroc Stretch Limitations 10x 3 seconds   Knee/Hip Exercises: Standing   Forward Lunges Both;2 sets;10 reps   Forward Lunges Limitations 6" box lunge with same side rotationa nd opposite siode rotation with focus on increasing knee valgus moment. , 3rd set on floor with bilateral UE reach to knee height 10x . pain noted but diffiuculty maintaining neutral  knee position.    Side Lunges --   Side Lunges Limitations --   Functional Squat Limitations  squat walk arounds 10x to 6" box   Other Standing Knee Exercises 2D walking hip Excursions, 43f,   Manual Therapy   Joint Mobilization hamstring soft tissue mobilization and patellar mobilizations in all directions.           PT Short Term Goals - 02/04/15 1550    PT SHORT TERM GOAL #1   Title Patient will dmoenstrate increased knee flexion AROm to 0 degrees to be able to fully extend knee durign gait   Status On-going   PT SHORT TERM GOAL #2   Title Patient will demsontrate increased knee flexion to 125 degrees to be bale to squat to reach to ankle for lifting.    Status Achieved   PT SHORT TERM GOAL #3   Title Patient will demostrate increased hamstring strength ot 4+/5 to be able to lunge to the floor and ambualte withtou assistive device    Status Partially Met   PT SHORT TERM GOAL #4   Title Patient will dmeostrate increased quadriceps strength ot 4+/5 to be able to ambulate up stairs with 1 HHA.    Status Partially Met   PT SHORT TERM GOAL #5   Title Patient will be indepent with HEP.    Status Achieved           PT Long Term Goals - 02/04/15 1550    PT LONG TERM GOAL #1   Title Patient will be able to squat through full depth to reach low boxes   Status On-going   PT LONG TERM GOAL #2   Title Patient will be able to lift 80lb from the floor to return to work.    Status On-going   PT LONG TERM GOAL #3   Title patient will be able to go up and down stairs without HHA.    Status Partially Met            Plan - 02/04/15 1547    Clinical Impression Statement Session focused on increasing bilateral hip internal rotation and improving appropriate knee valgus moment during gait as patient initially displays no knee valgus during gait secondary to very limited hip mobility and limite glut flexibility. Patient ntowed knee feeling better following gait training. Patient displasy improving knee pain and improving strength. therapy to continue 2x a week for 4 more weeks to achieve long term goals.    PT Next Visit Plan continue lunges in therapy session and continued focus on increasign transverse plane motion into ankle and hips and increasing knee valgus moment during gait. educate patient on 3D dowel pendulems to promte increased thoraicc spine rotation to decrease strain on knee for returning to golf.         Problem List Patient Active Problem List   Diagnosis Date Noted  . Lumbago 05/10/2011   CDevona KonigPT DPT [address] [address]Phone: [phone]  Fax:  [phone]

## 2015-02-06 ENCOUNTER — Ambulatory Visit (HOSPITAL_COMMUNITY): Payer: Managed Care, Other (non HMO) | Admitting: Physical Therapy

## 2015-02-06 DIAGNOSIS — Z9889 Other specified postprocedural states: Secondary | ICD-10-CM

## 2015-02-06 DIAGNOSIS — R29898 Other symptoms and signs involving the musculoskeletal system: Secondary | ICD-10-CM

## 2015-02-06 DIAGNOSIS — M25561 Pain in right knee: Secondary | ICD-10-CM

## 2015-02-06 DIAGNOSIS — R262 Difficulty in walking, not elsewhere classified: Secondary | ICD-10-CM

## 2015-02-06 DIAGNOSIS — M25661 Stiffness of right knee, not elsewhere classified: Secondary | ICD-10-CM

## 2015-02-06 NOTE — Therapy (Signed)
[place], [name], [address] Phone: [phone]   Fax:  [phone]  Physical Therapy Treatment  Patient Details  Name: Marc Garcia MRN: [id number] Date of Birth: 06-27-1956 Referring Provider:  Sydnee Cabal, MD  Encounter Date: 02/06/2015      PT End of Session - 02/06/15 0925    Visit Number 17   Number of Visits 26   Date for PT Re-Evaluation 03/08/15   Authorization Type [employer]   Authorization - Visit Number 39   Authorization - Number of Visits 26   Activity Tolerance Patient tolerated treatment well   Behavior During Therapy [facility] for tasks assessed/performed      Past Medical History  Diagnosis Date  . Borderline diabetic   . Hypertension   . Hyperlipidemia     Past Surgical History  Procedure Laterality Date  . Hernia repair    . Knee surgery Right   . Cystectomy      There were no vitals filed for this visit.  Visit Diagnosis:  S/P right knee arthroscopy  Difficulty walking  Right knee pain  Knee stiffness, right  Right leg weakness      Subjective Assessment - 02/06/15 0852    Subjective "i'm havign one of my old age days with my whole body hurting."   Currently in Pain? Yes   Pain Score 5    Pain Location Knee   Pain Orientation Right;Left                       [facility] Adult PT Treatment/Exercise - 02/06/15 0001    Knee/Hip Exercises: Stretches   Active Hamstring Stretch Limitations 10x 3' 3 way to 14"    Quad Stretch 3 reps;30 seconds   Quad Stretch Limitations prone with rope with manual for rectus femoris   Hip Flexor Stretch Limitations 3 way hip drives to 14" box with 10x 3"    Piriformis Stretch Limitations 10x 3 second hold seated   Gastroc Stretch Limitations 10x 3 seconds   Knee/Hip Exercises: Standing   Forward Lunges Both;2 sets;10 reps   Forward Lunges Limitations 6" box lunge with same side rotationa and opposite siode rotation with focus  on increasing knee valgus moment. ,3rd set on floor with knee high reach    Functional Squat Limitations  squat walk arounds 10x to 6" box   Other Standing Knee Exercises Split stance 3D hip excursions; 2D walking hip Excursions, 40ft,   Other Standing Knee Exercises Standing lumbar extension with oerhead reach   Knee/Hip Exercises: Supine   Other Supine Knee Exercises knees to ches 4x 20 sconds with contract relax technique to increase muscle length.      2 way pick up and reach 10x 5lb dumbbells.              PT Short Term Goals - 02/04/15 1550    PT SHORT TERM GOAL #1   Title Patient will dmoenstrate increased knee flexion AROm to 0 degrees to be able to fully extend knee durign gait   Status On-going   PT SHORT TERM GOAL #2   Title Patient will demsontrate increased knee flexion to 125 degrees to be bale to squat to reach to ankle for lifting.    Status Achieved   PT SHORT TERM GOAL #3   Title Patient will demostrate increased hamstring strength ot 4+/5 to be able to lunge to the floor and ambualte withtou assistive device  Status Partially Met   PT SHORT TERM GOAL #4   Title Patient will dmeostrate increased quadriceps strength ot 4+/5 to be able to ambulate up stairs with 1 HHA.    Status Partially Met   PT SHORT TERM GOAL #5   Title Patient will be indepent with HEP.    Status Achieved           PT Long Term Goals - 02/04/15 1550    PT LONG TERM GOAL #1   Title Patient will be able to squat through full depth to reach low boxes   Status On-going   PT LONG TERM GOAL #2   Title Patient will be able to lift 80lb from the floor to return to work.    Status On-going   PT LONG TERM GOAL #3   Title patient will be able to go up and down stairs without HHA.    Status Partially Met               Plan - 02/06/15 0926    Clinical Impression Statement Session focused on improving knee biomechanics for gait with a secodnary focus on inceasign ability to bend  ofrward and touch toes to improve knees ability to bend/straghten so patient can more easilyu tie his shoes. Noted stiffness today increasing difficulty of squat walk arounds. pain minmally improved at nd of session.    PT Next Visit Plan continue lunges in therapy session and continued focus on increasign transverse plane motion into ankle and hips and increasing knee valgus moment during gait. educate patient on 3D dowel pendulems to promte increased thoraicc spine rotation to decrease strain on knee for returning to golf.        Problem List Patient Active Problem List   Diagnosis Date Noted  . Lumbago 05/10/2011   Devona Konig PT DPT [place], [name], [address] Phone: [phone]   Fax:  [phone]

## 2015-02-10 ENCOUNTER — Ambulatory Visit (HOSPITAL_COMMUNITY): Payer: Managed Care, Other (non HMO) | Admitting: Physical Therapy

## 2015-02-10 DIAGNOSIS — M25661 Stiffness of right knee, not elsewhere classified: Secondary | ICD-10-CM

## 2015-02-10 DIAGNOSIS — Z9889 Other specified postprocedural states: Secondary | ICD-10-CM

## 2015-02-10 DIAGNOSIS — R262 Difficulty in walking, not elsewhere classified: Secondary | ICD-10-CM

## 2015-02-10 DIAGNOSIS — M25561 Pain in right knee: Secondary | ICD-10-CM

## 2015-02-10 DIAGNOSIS — R29898 Other symptoms and signs involving the musculoskeletal system: Secondary | ICD-10-CM

## 2015-02-10 NOTE — Therapy (Signed)
[place], [name], [address] Phone: [phone]   Fax:  [phone]  Physical Therapy Treatment  Patient Details  Name: Marc Garcia MRN: [id number] Date of Birth: Nov 04, 1955 Referring Provider:  Sydnee Cabal, MD  Encounter Date: 02/10/2015      PT End of Session - 02/10/15 0856    Visit Number 18   Number of Visits 26   Date for PT Re-Evaluation 03/08/15   Authorization Type [employer]   Authorization Time Period Certification 83/29/1916-60/60/0459   Authorization - Visit Number 18   Authorization - Number of Visits 26   PT Start Time 0848   PT Stop Time 0930   PT Time Calculation (min) 42 min   Activity Tolerance Patient tolerated treatment well   Behavior During Therapy [facility] for tasks assessed/performed      Past Medical History  Diagnosis Date  . Borderline diabetic   . Hypertension   . Hyperlipidemia     Past Surgical History  Procedure Laterality Date  . Hernia repair    . Knee surgery Right   . Cystectomy      There were no vitals filed for this visit.  Visit Diagnosis:  S/P right knee arthroscopy  Difficulty walking  Right knee pain  Knee stiffness, right  Right leg weakness      Subjective Assessment - 02/10/15 0853    Subjective Paqtient notes minor soreness today in back and knees.    Currently in Pain? Yes   Pain Score 2    Pain Location Knee   Pain Orientation Right;Left   Pain Descriptors / Indicators Sore           [facility] Adult PT Treatment/Exercise - 02/10/15 0001    Knee/Hip Exercises: Stretches   Active Hamstring Stretch Limitations 10x to 12" 3D, 3 way standing hamstring reach with yellow ball 10x    Quad Stretch 3 reps;30 seconds   Quad Stretch Limitations prone with rope with manual for rectus femoris   Hip Flexor Stretch Limitations 3 way hip drives to 14" box with 10x 3"    Piriformis Stretch Limitations 10x 3 seconds   Gastroc Stretch 3 reps;20 seconds    Knee/Hip Exercises: Standing   Forward Lunges Limitations Anterior lateral lunge 10x each, anterior lunges 10x with FMR, both 10x   Forward Step Up Step Height: 6";10 reps;Hand Hold: 0   Other Standing Knee Exercises 3D dowel Pendulem,10x   Manual Therapy   Joint Mobilization hamstring soft tissue mobilization and patellar mobilizations in all directions.  tibial anterior lides prone grade 3                  PT Short Term Goals - 02/04/15 1550    PT SHORT TERM GOAL #1   Title Patient will dmoenstrate increased knee flexion AROm to 0 degrees to be able to fully extend knee durign gait   Status On-going   PT SHORT TERM GOAL #2   Title Patient will demsontrate increased knee flexion to 125 degrees to be bale to squat to reach to ankle for lifting.    Status Achieved   PT SHORT TERM GOAL #3   Title Patient will demostrate increased hamstring strength ot 4+/5 to be able to lunge to the floor and ambualte withtou assistive device   Status Partially Met   PT SHORT TERM GOAL #4   Title Patient will dmeostrate increased quadriceps strength ot 4+/5 to be able to ambulate up stairs with  1 HHA.    Status Partially Met   PT SHORT TERM GOAL #5   Title Patient will be indepent with HEP.    Status Achieved           PT Long Term Goals - 02/04/15 1550    PT LONG TERM GOAL #1   Title Patient will be able to squat through full depth to reach low boxes   Status On-going   PT LONG TERM GOAL #2   Title Patient will be able to lift 80lb from the floor to return to work.    Status On-going   PT LONG TERM GOAL #3   Title patient will be able to go up and down stairs without HHA.    Status Partially Met               Plan - 02/10/15 1041    Clinical Impression Statement Via palpation patient's knee pain appears to be attributed to Rt medial and lateral hamstring pain/stiffness for which session initially focused on improvign to decrease pain. Patient noted pain at end of  session improved though predominantly in medial knee. Following improvement in hamstring mobility session continued focus on increasign knee stability to improve gait mechanics/efficieny. noted improved gait follwoign 3D dowel pendulems secodnary to improved hip, thoracic spine, and trunk rotation.    PT Next Visit Plan continue lunges in therapy session and continued focus on increasign transverse plane motion into ankle and hips and increasing knee valgus moment during gait. Focus on increasing lateral hamstring         Problem List Patient Active Problem List   Diagnosis Date Noted  . Lumbago 05/10/2011    Nyaja Dubuque R 02/10/2015, 10:51 AM  [place] [address] [place], [name], [address] Phone: [phone]   Fax:  [phone]

## 2015-02-13 ENCOUNTER — Ambulatory Visit (HOSPITAL_COMMUNITY): Payer: Managed Care, Other (non HMO) | Admitting: Physical Therapy

## 2015-02-13 DIAGNOSIS — M25561 Pain in right knee: Secondary | ICD-10-CM

## 2015-02-13 DIAGNOSIS — Z9889 Other specified postprocedural states: Secondary | ICD-10-CM

## 2015-02-13 DIAGNOSIS — R262 Difficulty in walking, not elsewhere classified: Secondary | ICD-10-CM

## 2015-02-13 DIAGNOSIS — R29898 Other symptoms and signs involving the musculoskeletal system: Secondary | ICD-10-CM

## 2015-02-13 DIAGNOSIS — M25661 Stiffness of right knee, not elsewhere classified: Secondary | ICD-10-CM

## 2015-02-13 NOTE — Therapy (Signed)
[place], [name], [address] Phone: [phone]   Fax:  [phone]  Physical Therapy Treatment  Patient Details  Name: Marc Garcia MRN: [id number] Date of Birth: 08/28/1956 Referring Provider:  Sydnee Cabal, MD  Encounter Date: 02/13/2015      PT End of Session - 02/13/15 0908    Visit Number 19   Number of Visits 26   Date for PT Re-Evaluation 03/08/15   Authorization Time Period Certification 48/54/6270-35/00/9381; 02/04/15- 04/06/15   Authorization - Visit Number 23   Authorization - Number of Visits 26   PT Start Time 0850   PT Stop Time 0930   PT Time Calculation (min) 40 min   Activity Tolerance Patient tolerated treatment well   Behavior During Therapy [facility] for tasks assessed/performed      Past Medical History  Diagnosis Date  . Borderline diabetic   . Hypertension   . Hyperlipidemia     Past Surgical History  Procedure Laterality Date  . Hernia repair    . Knee surgery Right   . Cystectomy      There were no vitals filed for this visit.  Visit Diagnosis:  S/P right knee arthroscopy  Difficulty walking  Right knee pain  Knee stiffness, right  Right leg weakness      Subjective Assessment - 02/13/15 0856    Subjective Patrient notes only minor soreness no pain today   Pain Score 0-No pain   Pain Location Knee   Pain Descriptors / Indicators Sore            [facility] PT Assessment - 02/13/15 0001    Assessment   Medical Diagnosis Rt knee arthroplasty.    Onset Date 11/28/14   Next MD Visit Sydnee Cabal 02/16/2015   Prior Therapy no   Observation/Other Assessments   Focus on Therapeutic Outcomes (FOTO)  68% limited   AROM   Right Hip External Rotation  45   Right Hip Internal Rotation  22   Left Hip External Rotation  45   Left Hip Internal Rotation  40   Right Knee Extension -1   Right Knee Flexion 133   Left Knee Extension -1   Left Knee Flexion 132   Strength   Right Knee Flexion 4+/5   Right Knee Extension 4+/5            [facility] Adult PT Treatment/Exercise - 02/13/15 0001    Knee/Hip Exercises: Stretches   Active Hamstring Stretch Limitations 10x to 12" 3D, 3 way standing hamstring reach with yellow ball 10x    Quad Stretch 3 reps;30 seconds   Quad Stretch Limitations prone with rope with manual for rectus femoris   Piriformis Stretch Limitations 10x 3 seconds   Gastroc Stretch 3 reps;20 seconds   Knee/Hip Exercises: Standing   Forward Lunges Limitations anterior lunges 10x with FMR, both 10x floor, 2 sets: 1 dynamic 1 static   Side Lunges 10 reps;Both   Side Lunges Limitations floor   Lateral Step Up Step Height: 8";10 reps   Forward Step Up 10 reps;Hand Hold: 0;Both;Step Height: 8"   Step Down --   Functional Squat Limitations SF squat with frontal plane neutral   Other Standing Knee Exercises 3D hip Excursions 10x            PT Short Term Goals - 02/13/15 0916    PT SHORT TERM GOAL #1   Title Patient will dmoenstrate increased knee flexion AROm to 0  degrees to be able to fully extend knee durign gait   Status Partially Met   PT SHORT TERM GOAL #2   Title Patient will demsontrate increased knee flexion to 125 degrees to be bale to squat to reach to ankle for lifting.    Status Achieved   PT SHORT TERM GOAL #3   Title Patient will demostrate increased hamstring strength ot 4+/5 to be able to lunge to the floor and ambualte withtou assistive device   Status Achieved   PT SHORT TERM GOAL #4   Title Patient will dmeostrate increased quadriceps strength ot 4+/5 to be able to ambulate up stairs with 1 HHA.    Status Achieved   PT SHORT TERM GOAL #5   Title Patient will be indepent with HEP.    Status Achieved           PT Long Term Goals - 02/13/15 5361    PT LONG TERM GOAL #1   Title Patient will be able to squat through full depth to reach low boxes   Status On-going   PT LONG TERM GOAL #2   Title Patient  will be able to lift 80lb from the floor to return to work.    Status On-going   PT LONG TERM GOAL #3   Title patient will be able to go up and down stairs without HHA.    Status Achieved               Plan - 02/13/15 0928    Clinical Impression Statement Patient will be seeing MD on Monday with therapy put on hold until after. patient has made great progress with strength and ROM as well as normal gait mechanics,b but patient contineus to hve intermittent pain and soreness with prolonged activity. Recommending to continue therapy though patient believes he may need a TKA for which therapy would be placed on hold with patient performign HEP until surgery.    PT Next Visit Plan continue lunges in therapy session and continued focus on increasign transverse plane motion into ankle and hips and increasing knee valgus moment during gait. Focus on increasing lateral hamstring         Problem List Patient Active Problem List   Diagnosis Date Noted  . Lumbago 05/10/2011   Devona Konig PT DPT [employer] [place], [name], [address] Phone: [phone]   Fax:  [phone]

## 2015-02-17 ENCOUNTER — Encounter (HOSPITAL_COMMUNITY): Payer: Self-pay | Admitting: Emergency Medicine

## 2015-02-17 ENCOUNTER — Emergency Department (HOSPITAL_COMMUNITY)
Admission: EM | Admit: 2015-02-17 | Discharge: 2015-02-17 | Disposition: A | Discharge status: Home / Self Care | Payer: Managed Care, Other (non HMO) | Attending: Emergency Medicine | Admitting: Emergency Medicine

## 2015-02-17 DIAGNOSIS — H9201 Otalgia, right ear: Secondary | ICD-10-CM | POA: Diagnosis present

## 2015-02-17 DIAGNOSIS — I10 Essential (primary) hypertension: Secondary | ICD-10-CM | POA: Diagnosis not present

## 2015-02-17 DIAGNOSIS — Z87891 Personal history of nicotine dependence: Secondary | ICD-10-CM | POA: Insufficient documentation

## 2015-02-17 DIAGNOSIS — Z79899 Other long term (current) drug therapy: Secondary | ICD-10-CM | POA: Diagnosis not present

## 2015-02-17 DIAGNOSIS — Z7982 Long term (current) use of aspirin: Secondary | ICD-10-CM | POA: Diagnosis not present

## 2015-02-17 DIAGNOSIS — E785 Hyperlipidemia, unspecified: Secondary | ICD-10-CM | POA: Insufficient documentation

## 2015-02-17 DIAGNOSIS — H61891 Other specified disorders of right external ear: Secondary | ICD-10-CM

## 2015-02-17 MED ORDER — LIDOCAINE VISCOUS 2 % MT SOLN
15.0000 mL | Freq: Once | OROMUCOSAL | Status: AC
  Administered 2015-02-17: 15 mL via OROMUCOSAL
  Filled 2015-02-17: qty 15

## 2015-02-17 MED ORDER — OXYCODONE-ACETAMINOPHEN 5-325 MG PO TABS: 1.0000 | ORAL_TABLET | ORAL | Status: DC | PRN

## 2015-02-17 MED ORDER — OXYCODONE-ACETAMINOPHEN 5-325 MG PO TABS
2.0000 | ORAL_TABLET | Freq: Once | ORAL | Status: AC
  Administered 2015-02-17: 2 via ORAL
  Filled 2015-02-17: qty 2

## 2015-02-17 MED ORDER — OFLOXACIN 0.3 % OT SOLN: 5.0000 [drp] | Freq: Two times a day (BID) | OTIC | Status: DC

## 2015-02-17 MED ORDER — IBUPROFEN 400 MG PO TABS
600.0000 mg | ORAL_TABLET | Freq: Once | ORAL | Status: AC
  Administered 2015-02-17: 600 mg via ORAL
  Filled 2015-02-17: qty 2
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
## 2015-02-17 NOTE — ED Notes (Signed)
Pt verbalized understanding of no driving and to use caution within 4 hours of taking pain meds due to meds cause drowsiness 
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
## 2015-04-22 ENCOUNTER — Other Ambulatory Visit: Payer: Self-pay | Admitting: Orthopedic Surgery

## 2015-05-06 NOTE — Therapy (Signed)
[place], [name], [address] Phone: [phone]   Fax:  [phone]  Patient Details  Name: Marc Garcia MRN: [id number] Date of Birth: 1956-01-29 Referring Provider:  Sydnee Cabal, MD  Encounter Date: 02/13/2015 PHYSICAL THERAPY DISCHARGE SUMMARY  Visits from Start of Care: 19  Current functional level related to goals / functional outcomes: I HEP; able to go up and down steps without pain   Remaining deficits: Difficult to squat   Education / Equipment: HEP  Plan: Patient agrees to discharge.  Patient goals were partially met. Patient is being discharged due to not returning since the last visit.  ?????      Rayetta Humphrey, PT CLT [phone]  05/06/2015, 1:39 PM  [place] [address] [place], [name], [address] Phone: [phone]   Fax:  [phone]

## 2015-05-08 ENCOUNTER — Encounter (HOSPITAL_COMMUNITY)
Admission: RE | Admit: 2015-05-08 | Discharge: 2015-05-08 | Disposition: A | Discharge status: Home / Self Care | Payer: Managed Care, Other (non HMO) | Source: Ambulatory Visit | Attending: Specialist | Admitting: Specialist

## 2015-05-08 ENCOUNTER — Encounter (HOSPITAL_COMMUNITY): Payer: Self-pay

## 2015-05-08 DIAGNOSIS — M1711 Unilateral primary osteoarthritis, right knee: Secondary | ICD-10-CM | POA: Insufficient documentation

## 2015-05-08 DIAGNOSIS — Z01812 Encounter for preprocedural laboratory examination: Secondary | ICD-10-CM | POA: Diagnosis not present

## 2015-05-08 HISTORY — DX: Unspecified osteoarthritis, unspecified site: M19.90

## 2015-05-08 HISTORY — DX: Gastro-esophageal reflux disease without esophagitis: K21.9

## 2015-05-08 HISTORY — DX: Emphysema, unspecified: J43.9

## 2015-05-08 LAB — SURGICAL PCR SCREEN
MRSA, PCR: NEGATIVE
Staphylococcus aureus: NEGATIVE

## 2015-05-08 LAB — CBC
HCT: 40.2 % (ref 39.0–52.0)
Hemoglobin: 13.3 g/dL (ref 13.0–17.0)
MCH: 27.7 pg (ref 26.0–34.0)
MCHC: 33.1 g/dL (ref 30.0–36.0)
MCV: 83.6 fL (ref 78.0–100.0)
PLATELETS: 189 10*3/uL (ref 150–400)
RBC: 4.81 MIL/uL (ref 4.22–5.81)
RDW: 13.4 % (ref 11.5–15.5)
WBC: 5.9 10*3/uL (ref 4.0–10.5)

## 2015-05-08 LAB — BASIC METABOLIC PANEL
ANION GAP: 7 (ref 5–15)
BUN: 13 mg/dL (ref 6–20)
CHLORIDE: 103 mmol/L (ref 101–111)
CO2: 30 mmol/L (ref 22–32)
CREATININE: 1.04 mg/dL (ref 0.61–1.24)
Calcium: 9.3 mg/dL (ref 8.9–10.3)
GFR calc Af Amer: >60 mL/min (ref >60)
GFR calc non Af Amer: >60 mL/min (ref >60)
Glucose, Bld: 106 mg/dL — ABNORMAL HIGH (ref 65–99)
POTASSIUM: 3.7 mmol/L (ref 3.5–5.1)
Sodium: 140 mmol/L (ref 135–145)

## 2015-05-08 LAB — URINALYSIS, ROUTINE W REFLEX MICROSCOPIC
Bilirubin Urine: NEGATIVE
Glucose, UA: NEGATIVE mg/dL
Hgb urine dipstick: NEGATIVE
KETONES UR: NEGATIVE mg/dL
LEUKOCYTES UA: NEGATIVE
Nitrite: NEGATIVE
PROTEIN: NEGATIVE mg/dL
Specific Gravity, Urine: 1.023 (ref 1.005–1.030)
Urobilinogen, UA: 0.2 mg/dL (ref 0.0–1.0)
pH: 5.5 (ref 5.0–8.0)

## 2015-05-08 LAB — APTT: APTT: 28 s (ref 24–37)

## 2015-05-08 LAB — PROTIME-INR
INR: 1.05 (ref 0.00–1.49)
Prothrombin Time: 13.9 seconds (ref 11.6–15.2)

## 2015-05-08 LAB — ABO/RH: ABO/RH(D): O POS

## 2015-05-08 NOTE — Progress Notes (Signed)
OV note per chart per Dr Rollene Fare 08/17/2012 Echocardiography report per chart 07/20/2012  Stress test results per chart 07/20/2012

## 2015-05-08 NOTE — Progress Notes (Addendum)
Clearance note per chart per Dr Hilma Favors 04/09/2015  H&P per chart per Dr Hilma Favors 04/09/2015  EKG per epic 05/30/2014 CXR epic 05/17/2014  OV note per Dr Branch/cardiology/epic 05/30/2014

## 2015-05-08 NOTE — Patient Instructions (Signed)
Marc Garcia  05/08/2015   Your procedure is scheduled on: Friday May 15, 2015   Report to [facility] Main  Entrance take [place]  elevators to 3rd floor to  [place] at 10:30 AM.  Call this number if you have problems the morning of surgery [phone]   Remember: ONLY 1 PERSON MAY GO WITH YOU TO SHORT STAY TO GET  READY MORNING OF [name].  Do not eat food or drink liquids :After Midnight.     Take these medicines the morning of surgery with A SIP OF WATER: Hydrocodone-Acetaminophen if needed;  Pantoprazole (Protonix)                               You may not have any metal on your body including hair pins and              piercings  Do not wear jewerly, lotions, powders or colognes, deodorant                          Men may shave face and neck.   Do not bring valuables to the hospital. [place].  Contacts, dentures or bridgework may not be worn into surgery.  Leave suitcase in the car. After surgery it may be brought to your room.     Special Instructions: DO NOT REMOVE BLUE BLOOD BAND PRIOR TO SURGERY DATE              Please read over the following fact sheets you were given:MRSA INFORMATION SHEET; INCENTIVE SPIROMETER; BLOOD TRANSFUSION INFORMATION SHEET  _____________________________________________________________________             [facility] - Preparing for Surgery Before surgery, you can play an important role.  Because skin is not sterile, your skin needs to be as free of germs as possible.  You can reduce the number of germs on your skin by washing with CHG (chlorahexidine gluconate) soap before surgery.  CHG is an antiseptic cleaner which kills germs and bonds with the skin to continue killing germs even after washing. Please DO NOT use if you have an allergy to CHG or antibacterial soaps.  If your skin becomes reddened/irritated stop using the CHG and inform your nurse when you  arrive at Short Stay. Do not shave (including legs and underarms) for at least 48 hours prior to the first CHG shower.  You may shave your face/neck. Please follow these instructions carefully:  1.  Shower with CHG Soap the night before surgery and the  morning of Surgery.  2.  If you choose to wash your hair, wash your hair first as usual with your  normal  shampoo.  3.  After you shampoo, rinse your hair and body thoroughly to remove the  shampoo.                           4.  Use CHG as you would any other liquid soap.  You can apply chg directly  to the skin and wash                       Gently with a  scrungie or clean washcloth.  5.  Apply the CHG Soap to your body ONLY FROM THE NECK DOWN.   Do not use on face/ open                           Wound or open sores. Avoid contact with eyes, ears mouth and genitals (private parts).                       Wash face,  Genitals (private parts) with your normal soap.             6.  Wash thoroughly, paying special attention to the area where your surgery  will be performed.  7.  Thoroughly rinse your body with warm water from the neck down.  8.  DO NOT shower/wash with your normal soap after using and rinsing off  the CHG Soap.                9.  Pat yourself dry with a clean towel.            10.  Wear clean pajamas.            11.  Place clean sheets on your bed the night of your first shower and do not  sleep with pets. Day of Surgery : Do not apply any lotions/deodorants the morning of surgery.  Please wear clean clothes to the hospital/surgery center.  FAILURE TO FOLLOW THESE INSTRUCTIONS MAY RESULT IN THE CANCELLATION OF YOUR SURGERY PATIENT SIGNATURE_________________________________  NURSE SIGNATURE__________________________________  ________________________________________________________________________   Marc Garcia  An incentive spirometer is a tool that can help keep your lungs clear and active. This tool measures how  well you are filling your lungs with each breath. Taking long deep breaths may help reverse or decrease the chance of developing breathing (pulmonary) problems (especially infection) following:  A long period of time when you are unable to move or be active. BEFORE THE PROCEDURE   If the spirometer includes an indicator to show your best effort, your nurse or respiratory therapist will set it to a desired goal.  If possible, sit up straight or lean slightly forward. Try not to slouch.  Hold the incentive spirometer in an upright position. INSTRUCTIONS FOR USE   Sit on the edge of your bed if possible, or sit up as far as you can in bed or on a chair.  Hold the incentive spirometer in an upright position.  Breathe out normally.  Place the mouthpiece in your mouth and seal your lips tightly around it.  Breathe in slowly and as deeply as possible, raising the piston or the ball toward the top of the column.  Hold your breath for 3-5 seconds or for as long as possible. Allow the piston or ball to fall to the bottom of the column.  Remove the mouthpiece from your mouth and breathe out normally.  Rest for a few seconds and repeat Steps 1 through 7 at least 10 times every 1-2 hours when you are awake. Take your time and take a few normal breaths between deep breaths.  The spirometer may include an indicator to show your best effort. Use the indicator as a goal to work toward during each repetition.  After each set of 10 deep breaths, practice coughing to be sure your lungs are clear. If you have an incision (the cut made at the time of surgery), support your incision  when coughing by placing a pillow or rolled up towels firmly against it. Once you are able to get out of bed, walk around indoors and cough well. You may stop using the incentive spirometer when instructed by your caregiver.  RISKS AND COMPLICATIONS  Take your time so you do not get dizzy or light-headed.  If you are in  pain, you may need to take or ask for pain medication before doing incentive spirometry. It is harder to take a deep breath if you are having pain. AFTER USE  Rest and breathe slowly and easily.  It can be helpful to keep track of a log of your progress. Your caregiver can provide you with a simple table to help with this. If you are using the spirometer at home, follow these instructions: [place] IF:   You are having difficultly using the spirometer.  You have trouble using the spirometer as often as instructed.  Your pain medication is not giving enough relief while using the spirometer.  You develop fever of 100.5 F (38.1 C) or higher. SEEK IMMEDIATE MEDICAL CARE IF:   You cough up bloody sputum that had not been present before.  You develop fever of 102 F (38.9 C) or greater.  You develop worsening pain at or near the incision site. MAKE SURE YOU:   Understand these instructions.  Will watch your condition.  Will get help right away if you are not doing well or get worse. Document Released: 02/27/2007 Document Revised: 01/09/2012 Document Reviewed: 04/30/2007 ExitCare Patient Information 2014 Exit[employer], [place].   ________________________________________________________________________  WHAT IS A BLOOD TRANSFUSION? Blood Transfusion Information  A transfusion is the replacement of blood or some of its parts. Blood is made up of multiple cells which provide different functions.  Red blood cells carry oxygen and are used for blood loss replacement.  White blood cells fight against infection.  Platelets control bleeding.  Plasma helps clot blood.  Other blood products are available for specialized needs, such as hemophilia or other clotting disorders. BEFORE THE TRANSFUSION  Who gives blood for transfusions?   Healthy volunteers who are fully evaluated to make sure their blood is safe. This is blood bank blood. Transfusion therapy is the safest it has  ever been in the practice of medicine. Before blood is taken from a donor, a complete history is taken to make sure that person has no history of diseases nor engages in risky social behavior (examples are intravenous drug use or sexual activity with multiple partners). The donor's travel history is screened to minimize risk of transmitting infections, such as malaria. The donated blood is tested for signs of infectious diseases, such as HIV and hepatitis. The blood is then tested to be sure it is compatible with you in order to minimize the chance of a transfusion reaction. If you or a relative donates blood, this is often done in anticipation of surgery and is not appropriate for emergency situations. It takes many days to process the donated blood. RISKS AND COMPLICATIONS Although transfusion therapy is very safe and saves many lives, the main dangers of transfusion include:   Getting an infectious disease.  Developing a transfusion reaction. This is an allergic reaction to something in the blood you were given. Every precaution is taken to prevent this. The decision to have a blood transfusion has been considered carefully by your caregiver before blood is given. Blood is not given unless the benefits outweigh the risks. AFTER THE TRANSFUSION  Right after  receiving a blood transfusion, you will usually feel much better and more energetic. This is especially true if your red blood cells have gotten low (anemic). The transfusion raises the level of the red blood cells which carry oxygen, and this usually causes an energy increase.  The nurse administering the transfusion will monitor you carefully for complications. HOME CARE INSTRUCTIONS  No special instructions are needed after a transfusion. You may find your energy is better. Speak with your caregiver about any limitations on activity for underlying diseases you may have. SEEK MEDICAL CARE IF:   Your condition is not improving after your  transfusion.  You develop redness or irritation at the intravenous (IV) site. SEEK IMMEDIATE MEDICAL CARE IF:  Any of the following symptoms occur over the next 12 hours:  Shaking chills.  You have a temperature by mouth above 102 F (38.9 C), not controlled by medicine.  Chest, back, or muscle pain.  People around you feel you are not acting correctly or are confused.  Shortness of breath or difficulty breathing.  Dizziness and fainting.  You get a rash or develop hives.  You have a decrease in urine output.  Your urine turns a dark color or changes to pink, red, or brown. Any of the following symptoms occur over the next 10 days:  You have a temperature by mouth above 102 F (38.9 C), not controlled by medicine.  Shortness of breath.  Weakness after normal activity.  The white part of the eye turns yellow (jaundice).  You have a decrease in the amount of urine or are urinating less often.  Your urine turns a dark color or changes to pink, red, or brown. Document Released: 10/14/2000 Document Revised: 01/09/2012 Document Reviewed: 06/02/2008 [facility] Patient Information 2014 [place], [place].  _______________________________________________________________________

## 2015-05-11 NOTE — H&P (Signed)
TOTAL KNEE ADMISSION H&P  Patient is being admitted for right total knee arthroplasty.  Subjective:  Chief Complaint:right knee pain.  HPI: Marc Garcia, 59 y.o. male, has a history of pain and functional disability in the right knee due to arthritis and has failed non-surgical conservative treatments for greater than 12 weeks to includeNSAID's and/or analgesics, corticosteriod injections, viscosupplementation injections, flexibility and strengthening excercises, supervised PT with diminished ADL's post treatment, use of assistive devices and activity modification.  Onset of symptoms was gradual, starting 2 years ago with gradually worsening course since that time. The patient noted prior procedures on the knee to include  arthroscopy on the right knee(s).  Patient currently rates pain in the right knee(s) at 7 out of 10 with activity. Patient has worsening of pain with activity and weight bearing, pain that interferes with activities of daily living, pain with passive range of motion and joint swelling.  Patient has evidence of subchondral sclerosis and periarticular osteophytes by imaging studies. This patient has had osteoarthritis. There is no active infection.  Patient Active Problem List   Diagnosis Date Noted  . Lumbago 05/10/2011   Past Medical History  Diagnosis Date  . Borderline diabetic   . Hypertension   . Hyperlipidemia   . Emphysema of lung   . GERD (gastroesophageal reflux disease)   . Arthritis     Past Surgical History  Procedure Laterality Date  . Knee surgery Right     times 2  . Cystectomy    . Hernia repair      inguinal bilat   . Colonscopy       No prescriptions prior to admission   No Known Allergies  History  Substance Use Topics  . Smoking status: Former Smoker -- 1.00 packs/day for 40 years    Types: Cigarettes    Quit date: 05/14/2012  . Smokeless tobacco: Never Used  . Alcohol Use: No    No family history on file.   Review of Systems   Constitutional: Negative.   HENT: Negative.   Eyes: Negative.   Respiratory: Negative.   Cardiovascular: Negative.   Gastrointestinal: Negative.   Genitourinary: Negative.   Musculoskeletal: Positive for joint pain.  Skin: Negative.   Neurological: Negative.   Endo/Heme/Allergies: Negative.   Psychiatric/Behavioral: Negative.     Objective:  Physical Exam  Constitutional: He is oriented to person, place, and time. He appears well-developed.  HENT:  Head: Normocephalic.  Eyes: EOM are normal.  Neck: Normal range of motion.  Cardiovascular: Normal rate, normal heart sounds and intact distal pulses.   Respiratory: Effort normal.  GI: Soft.  Genitourinary:  Deferred  Musculoskeletal:  Right knee pain with ROM.  Neurological: He is alert and oriented to person, place, and time. He has normal reflexes.  Skin: Skin is warm and dry.  Psychiatric: His behavior is normal.    Vital signs in last 24 hours:    Labs:   Estimated body mass index is 25.76 kg/(m^2) as calculated from the following:   Height as of 02/17/15: 6' (1.829 m).   Weight as of 02/17/15: 86.183 kg (190 lb).   Imaging Review Plain radiographs demonstrate moderate degenerative joint disease of the right knee(s). The overall alignment ismild varus. The bone quality appears to be good for age and reported activity level.  Assessment/Plan:  End stage arthritis, right knee   The patient history, physical examination, clinical judgment of the provider and imaging studies are consistent with end stage degenerative joint disease of  the right knee(s) and total knee arthroplasty is deemed medically necessary. The treatment options including medical management, injection therapy arthroscopy and arthroplasty were discussed at length. The risks and benefits of total knee arthroplasty were presented and reviewed. The risks due to aseptic loosening, infection, stiffness, patella tracking problems, thromboembolic  complications and other imponderables were discussed. The patient acknowledged the explanation, agreed to proceed with the plan and consent was signed. Patient is being admitted for inpatient treatment for surgery, pain control, PT, OT, prophylactic antibiotics, VTE prophylaxis, progressive ambulation and ADL's and discharge planning. The patient is planning to be discharged home with home health services.  Contraindications and adverse affects of Tranexamic acid discussed in detail. Patient denies any of these at this time and understands the risks and benefits.

## 2015-05-15 ENCOUNTER — Encounter (HOSPITAL_COMMUNITY): Payer: Self-pay | Admitting: Anesthesiology

## 2015-05-15 ENCOUNTER — Encounter (HOSPITAL_COMMUNITY)
Admission: RE | Disposition: A | Discharge status: Home Health | Payer: Self-pay | Source: Ambulatory Visit | Attending: Specialist

## 2015-05-15 ENCOUNTER — Inpatient Hospital Stay (HOSPITAL_COMMUNITY): Payer: Managed Care, Other (non HMO) | Admitting: Anesthesiology

## 2015-05-15 ENCOUNTER — Inpatient Hospital Stay (HOSPITAL_COMMUNITY)
Admission: RE | Admit: 2015-05-15 | Discharge: 2015-05-17 | DRG: 470 | Disposition: A | Discharge status: Home Health | Payer: Managed Care, Other (non HMO) | Source: Ambulatory Visit | Attending: Specialist | Admitting: Specialist

## 2015-05-15 DIAGNOSIS — E785 Hyperlipidemia, unspecified: Secondary | ICD-10-CM | POA: Diagnosis present

## 2015-05-15 DIAGNOSIS — M25761 Osteophyte, right knee: Secondary | ICD-10-CM | POA: Diagnosis present

## 2015-05-15 DIAGNOSIS — Z87891 Personal history of nicotine dependence: Secondary | ICD-10-CM

## 2015-05-15 DIAGNOSIS — K219 Gastro-esophageal reflux disease without esophagitis: Secondary | ICD-10-CM | POA: Diagnosis present

## 2015-05-15 DIAGNOSIS — I10 Essential (primary) hypertension: Secondary | ICD-10-CM | POA: Diagnosis present

## 2015-05-15 DIAGNOSIS — M1711 Unilateral primary osteoarthritis, right knee: Secondary | ICD-10-CM

## 2015-05-15 DIAGNOSIS — M25561 Pain in right knee: Secondary | ICD-10-CM | POA: Diagnosis present

## 2015-05-15 DIAGNOSIS — M179 Osteoarthritis of knee, unspecified: Principal | ICD-10-CM | POA: Diagnosis present

## 2015-05-15 DIAGNOSIS — J439 Emphysema, unspecified: Secondary | ICD-10-CM | POA: Diagnosis present

## 2015-05-15 DIAGNOSIS — Z01812 Encounter for preprocedural laboratory examination: Secondary | ICD-10-CM

## 2015-05-15 DIAGNOSIS — Z96659 Presence of unspecified artificial knee joint: Secondary | ICD-10-CM

## 2015-05-15 HISTORY — PX: TOTAL KNEE ARTHROPLASTY: SHX125

## 2015-05-15 LAB — GLUCOSE, CAPILLARY
Glucose-Capillary: 116 mg/dL — ABNORMAL HIGH (ref 65–99)
Glucose-Capillary: 112 mg/dL — ABNORMAL HIGH (ref 65–99)

## 2015-05-15 LAB — TYPE AND SCREEN
ABO/RH(D): O POS
ANTIBODY SCREEN: NEGATIVE

## 2015-05-15 SURGERY — ARTHROPLASTY, KNEE, TOTAL
Anesthesia: Spinal | Site: Knee | Laterality: Right

## 2015-05-15 MED ORDER — DIPHENHYDRAMINE HCL 12.5 MG/5ML PO ELIX: 12.5000 mg | ORAL_SOLUTION | ORAL | Status: DC | PRN

## 2015-05-15 MED ORDER — KETOROLAC TROMETHAMINE 30 MG/ML IJ SOLN
INTRAMUSCULAR | Status: AC
  Filled 2015-05-15: qty 1

## 2015-05-15 MED ORDER — METHOCARBAMOL 500 MG PO TABS
500.0000 mg | ORAL_TABLET | Freq: Four times a day (QID) | ORAL | Status: DC | PRN
  Administered 2015-05-16 – 2015-05-17 (×3): 500 mg via ORAL
  Filled 2015-05-15 (×3): qty 1

## 2015-05-15 MED ORDER — PROPOFOL 10 MG/ML IV BOLUS
INTRAVENOUS | Status: AC
  Filled 2015-05-15: qty 20

## 2015-05-15 MED ORDER — SENNA 8.6 MG PO TABS
2.0000 | ORAL_TABLET | Freq: Every day | ORAL | Status: DC
  Administered 2015-05-16 – 2015-05-17 (×2): 17.2 mg via ORAL

## 2015-05-15 MED ORDER — SODIUM CHLORIDE 0.9 % IJ SOLN
INTRAMUSCULAR | Status: AC
  Filled 2015-05-15: qty 50

## 2015-05-15 MED ORDER — SODIUM CHLORIDE 0.9 % IJ SOLN
INTRAMUSCULAR | Status: AC
  Filled 2015-05-15: qty 10

## 2015-05-15 MED ORDER — LIDOCAINE HCL (CARDIAC) 20 MG/ML IV SOLN
INTRAVENOUS | Status: AC
  Filled 2015-05-15: qty 5

## 2015-05-15 MED ORDER — FERROUS SULFATE 325 (65 FE) MG PO TABS
325.0000 mg | ORAL_TABLET | Freq: Three times a day (TID) | ORAL | Status: DC
  Administered 2015-05-16 – 2015-05-17 (×4): 325 mg via ORAL
  Filled 2015-05-15 (×9): qty 1

## 2015-05-15 MED ORDER — CEFAZOLIN SODIUM-DEXTROSE 2-3 GM-% IV SOLR
2.0000 g | Freq: Four times a day (QID) | INTRAVENOUS | Status: AC
  Administered 2015-05-15 – 2015-05-16 (×2): 2 g via INTRAVENOUS
  Filled 2015-05-15 (×2): qty 50

## 2015-05-15 MED ORDER — LACTATED RINGERS IV SOLN: INTRAVENOUS | Status: DC

## 2015-05-15 MED ORDER — PHENOL 1.4 % MT LIQD
1.0000 | OROMUCOSAL | Status: DC | PRN
  Filled 2015-05-15: qty 177

## 2015-05-15 MED ORDER — ONDANSETRON HCL 4 MG PO TABS: 4.0000 mg | ORAL_TABLET | Freq: Four times a day (QID) | ORAL | Status: DC | PRN

## 2015-05-15 MED ORDER — PHENYLEPHRINE 40 MCG/ML (10ML) SYRINGE FOR IV PUSH (FOR BLOOD PRESSURE SUPPORT)
PREFILLED_SYRINGE | INTRAVENOUS | Status: AC
  Filled 2015-05-15: qty 10

## 2015-05-15 MED ORDER — MIDAZOLAM HCL 5 MG/5ML IJ SOLN
INTRAMUSCULAR | Status: DC | PRN
  Administered 2015-05-15 (×2): 1 mg via INTRAVENOUS

## 2015-05-15 MED ORDER — CEFAZOLIN SODIUM-DEXTROSE 2-3 GM-% IV SOLR
INTRAVENOUS | Status: AC
  Filled 2015-05-15: qty 50

## 2015-05-15 MED ORDER — HYDROCHLOROTHIAZIDE 12.5 MG PO CAPS
12.5000 mg | ORAL_CAPSULE | Freq: Every day | ORAL | Status: DC
  Filled 2015-05-15 (×2): qty 1

## 2015-05-15 MED ORDER — METHOCARBAMOL 1000 MG/10ML IJ SOLN
500.0000 mg | Freq: Four times a day (QID) | INTRAVENOUS | Status: DC | PRN
  Administered 2015-05-15: 500 mg via INTRAVENOUS
  Filled 2015-05-15 (×2): qty 5

## 2015-05-15 MED ORDER — METOCLOPRAMIDE HCL 10 MG PO TABS: 5.0000 mg | ORAL_TABLET | Freq: Three times a day (TID) | ORAL | Status: DC | PRN

## 2015-05-15 MED ORDER — FENTANYL CITRATE (PF) 100 MCG/2ML IJ SOLN
INTRAMUSCULAR | Status: AC
  Filled 2015-05-15: qty 2

## 2015-05-15 MED ORDER — MIDAZOLAM HCL 2 MG/2ML IJ SOLN
INTRAMUSCULAR | Status: AC
  Filled 2015-05-15: qty 2

## 2015-05-15 MED ORDER — METOCLOPRAMIDE HCL 5 MG/ML IJ SOLN: 5.0000 mg | Freq: Three times a day (TID) | INTRAMUSCULAR | Status: DC | PRN

## 2015-05-15 MED ORDER — EPHEDRINE SULFATE 50 MG/ML IJ SOLN
INTRAMUSCULAR | Status: AC
  Filled 2015-05-15: qty 1

## 2015-05-15 MED ORDER — EPHEDRINE SULFATE 50 MG/ML IJ SOLN
INTRAMUSCULAR | Status: DC | PRN
  Administered 2015-05-15: 5 mg via INTRAVENOUS

## 2015-05-15 MED ORDER — PHENYLEPHRINE HCL 10 MG/ML IJ SOLN
INTRAMUSCULAR | Status: AC
Start: 2015-05-15 — End: 2015-05-15
  Filled 2015-05-15: qty 1

## 2015-05-15 MED ORDER — CEFAZOLIN SODIUM-DEXTROSE 2-3 GM-% IV SOLR
2.0000 g | INTRAVENOUS | Status: AC
  Administered 2015-05-15: 2 g via INTRAVENOUS

## 2015-05-15 MED ORDER — OXYCODONE-ACETAMINOPHEN 5-325 MG PO TABS: 1.0000 | ORAL_TABLET | ORAL | Status: DC | PRN

## 2015-05-15 MED ORDER — IRBESARTAN 150 MG PO TABS
150.0000 mg | ORAL_TABLET | Freq: Every day | ORAL | Status: DC
  Filled 2015-05-15 (×2): qty 1

## 2015-05-15 MED ORDER — FENTANYL CITRATE (PF) 100 MCG/2ML IJ SOLN
INTRAMUSCULAR | Status: DC | PRN
  Administered 2015-05-15: 50 ug via INTRAVENOUS

## 2015-05-15 MED ORDER — KETOROLAC TROMETHAMINE 30 MG/ML IJ SOLN
INTRAMUSCULAR | Status: DC | PRN
  Administered 2015-05-15: 30 mg

## 2015-05-15 MED ORDER — POTASSIUM CHLORIDE IN NACL 20-0.9 MEQ/L-% IV SOLN
INTRAVENOUS | Status: DC
  Administered 2015-05-15 – 2015-05-16 (×2): via INTRAVENOUS
  Filled 2015-05-15 (×5): qty 1000

## 2015-05-15 MED ORDER — DEXAMETHASONE SODIUM PHOSPHATE 10 MG/ML IJ SOLN
10.0000 mg | Freq: Once | INTRAMUSCULAR | Status: AC
  Administered 2015-05-16: 10 mg via INTRAVENOUS
  Filled 2015-05-15: qty 1

## 2015-05-15 MED ORDER — LACTATED RINGERS IV SOLN
INTRAVENOUS | Status: DC | PRN
  Administered 2015-05-15 (×2): via INTRAVENOUS

## 2015-05-15 MED ORDER — HYDROMORPHONE HCL 1 MG/ML IJ SOLN: 0.2500 mg | INTRAMUSCULAR | Status: DC | PRN

## 2015-05-15 MED ORDER — MAGNESIUM CITRATE PO SOLN: 1.0000 | Freq: Once | ORAL | Status: AC | PRN

## 2015-05-15 MED ORDER — OXYCODONE HCL 5 MG PO TABS
5.0000 mg | ORAL_TABLET | ORAL | Status: DC | PRN
  Administered 2015-05-15 – 2015-05-17 (×11): 10 mg via ORAL
  Filled 2015-05-15 (×10): qty 2

## 2015-05-15 MED ORDER — PHENYLEPHRINE HCL 10 MG/ML IJ SOLN
10.0000 mg | INTRAVENOUS | Status: DC | PRN
  Administered 2015-05-15: 10 ug/min via INTRAVENOUS

## 2015-05-15 MED ORDER — ACETAMINOPHEN 325 MG PO TABS
650.0000 mg | ORAL_TABLET | Freq: Four times a day (QID) | ORAL | Status: DC | PRN
  Administered 2015-05-16 – 2015-05-17 (×3): 650 mg via ORAL
  Filled 2015-05-15 (×3): qty 2

## 2015-05-15 MED ORDER — ONDANSETRON HCL 4 MG/2ML IJ SOLN: 4.0000 mg | Freq: Four times a day (QID) | INTRAMUSCULAR | Status: DC | PRN

## 2015-05-15 MED ORDER — PANTOPRAZOLE SODIUM 40 MG PO TBEC
40.0000 mg | DELAYED_RELEASE_TABLET | Freq: Every morning | ORAL | Status: DC
  Administered 2015-05-16 – 2015-05-17 (×2): 40 mg via ORAL
  Filled 2015-05-15 (×2): qty 1

## 2015-05-15 MED ORDER — BUPIVACAINE IN DEXTROSE 0.75-8.25 % IT SOLN
INTRATHECAL | Status: DC | PRN
  Administered 2015-05-15: 15 mg via INTRATHECAL

## 2015-05-15 MED ORDER — SODIUM CHLORIDE 0.9 % IJ SOLN
INTRAMUSCULAR | Status: DC | PRN
  Administered 2015-05-15: 30 mL

## 2015-05-15 MED ORDER — HYDROMORPHONE HCL 1 MG/ML IJ SOLN
1.0000 mg | INTRAMUSCULAR | Status: DC | PRN
  Administered 2015-05-15 (×2): 1 mg via INTRAVENOUS
  Filled 2015-05-15 (×2): qty 1

## 2015-05-15 MED ORDER — BISACODYL 10 MG RE SUPP: 10.0000 mg | Freq: Every day | RECTAL | Status: DC | PRN

## 2015-05-15 MED ORDER — ENOXAPARIN SODIUM 30 MG/0.3ML ~~LOC~~ SOLN
30.0000 mg | Freq: Two times a day (BID) | SUBCUTANEOUS | Status: DC
  Administered 2015-05-16 – 2015-05-17 (×3): 30 mg via SUBCUTANEOUS
  Filled 2015-05-15 (×5): qty 0.3

## 2015-05-15 MED ORDER — ASPIRIN EC 325 MG PO TBEC: 325.0000 mg | DELAYED_RELEASE_TABLET | Freq: Two times a day (BID) | ORAL | Status: DC

## 2015-05-15 MED ORDER — ALUM & MAG HYDROXIDE-SIMETH 200-200-20 MG/5ML PO SUSP: 30.0000 mL | ORAL | Status: DC | PRN

## 2015-05-15 MED ORDER — ZOLPIDEM TARTRATE 5 MG PO TABS: 5.0000 mg | ORAL_TABLET | Freq: Every evening | ORAL | Status: DC | PRN

## 2015-05-15 MED ORDER — SODIUM CHLORIDE 0.9 % IR SOLN
Status: DC | PRN
  Administered 2015-05-15: 1000 mL

## 2015-05-15 MED ORDER — DEXAMETHASONE SODIUM PHOSPHATE 10 MG/ML IJ SOLN
INTRAMUSCULAR | Status: AC
  Filled 2015-05-15: qty 1

## 2015-05-15 MED ORDER — POLYETHYLENE GLYCOL 3350 17 G PO PACK
17.0000 g | PACK | Freq: Every day | ORAL | Status: DC | PRN
  Administered 2015-05-16: 17 g via ORAL
  Filled 2015-05-15: qty 1

## 2015-05-15 MED ORDER — PHENYLEPHRINE HCL 10 MG/ML IJ SOLN
INTRAMUSCULAR | Status: DC | PRN
  Administered 2015-05-15: 40 ug via INTRAVENOUS
  Administered 2015-05-15 (×3): 80 ug via INTRAVENOUS

## 2015-05-15 MED ORDER — POVIDONE-IODINE 7.5 % EX SOLN: Freq: Once | CUTANEOUS | Status: DC

## 2015-05-15 MED ORDER — LACTATED RINGERS IV SOLN
INTRAVENOUS | Status: DC
  Administered 2015-05-15: 1000 mL via INTRAVENOUS

## 2015-05-15 MED ORDER — BUPIVACAINE-EPINEPHRINE 0.25% -1:200000 IJ SOLN
INTRAMUSCULAR | Status: DC | PRN
  Administered 2015-05-15: 30 mL

## 2015-05-15 MED ORDER — ACETAMINOPHEN 650 MG RE SUPP: 650.0000 mg | Freq: Four times a day (QID) | RECTAL | Status: DC | PRN

## 2015-05-15 MED ORDER — ONDANSETRON HCL 4 MG/2ML IJ SOLN
INTRAMUSCULAR | Status: DC | PRN
  Administered 2015-05-15: 4 mg via INTRAVENOUS

## 2015-05-15 MED ORDER — BUPIVACAINE-EPINEPHRINE (PF) 0.25% -1:200000 IJ SOLN
INTRAMUSCULAR | Status: AC
  Filled 2015-05-15: qty 30

## 2015-05-15 MED ORDER — ATORVASTATIN CALCIUM 10 MG PO TABS
10.0000 mg | ORAL_TABLET | Freq: Every morning | ORAL | Status: DC
  Administered 2015-05-16 – 2015-05-17 (×2): 10 mg via ORAL
  Filled 2015-05-15 (×2): qty 1

## 2015-05-15 MED ORDER — MENTHOL 3 MG MT LOZG: 1.0000 | LOZENGE | OROMUCOSAL | Status: DC | PRN

## 2015-05-15 MED ORDER — LIDOCAINE HCL (CARDIAC) 20 MG/ML IV SOLN
INTRAVENOUS | Status: DC | PRN
  Administered 2015-05-15: 50 mg via INTRAVENOUS

## 2015-05-15 MED ORDER — PROPOFOL INFUSION 10 MG/ML OPTIME
INTRAVENOUS | Status: DC | PRN
  Administered 2015-05-15: 75 ug/kg/min via INTRAVENOUS

## 2015-05-15 MED ORDER — METHOCARBAMOL 500 MG PO TABS: 500.0000 mg | ORAL_TABLET | Freq: Four times a day (QID) | ORAL | Status: DC | PRN

## 2015-05-15 MED ORDER — SODIUM CHLORIDE 0.9 % IV SOLN
1000.0000 mg | INTRAVENOUS | Status: AC
  Administered 2015-05-15: 1000 mg via INTRAVENOUS
  Filled 2015-05-15: qty 10

## 2015-05-15 MED ORDER — DEXAMETHASONE SODIUM PHOSPHATE 10 MG/ML IJ SOLN
10.0000 mg | Freq: Once | INTRAMUSCULAR | Status: AC
  Administered 2015-05-15: 10 mg via INTRAVENOUS

## 2015-05-15 MED ORDER — OLMESARTAN MEDOXOMIL-HCTZ 20-12.5 MG PO TABS: 1.0000 | ORAL_TABLET | Freq: Every morning | ORAL | Status: DC

## 2015-05-15 SURGICAL SUPPLY — 70 items
BAG DECANTER FOR FLEXI CONT (MISCELLANEOUS) IMPLANT
BAG SPEC THK2 15X12 ZIP CLS (MISCELLANEOUS) ×2
BAG ZIPLOCK 12X15 (MISCELLANEOUS) ×4 IMPLANT
BANDAGE ELASTIC 4 VELCRO ST LF (GAUZE/BANDAGES/DRESSINGS) ×2 IMPLANT
BANDAGE ELASTIC 6 VELCRO ST LF (GAUZE/BANDAGES/DRESSINGS) ×2 IMPLANT
BANDAGE ESMARK 6X9 LF (GAUZE/BANDAGES/DRESSINGS) ×1 IMPLANT
BLADE SAG 18X100X1.27 (BLADE) ×2 IMPLANT
BLADE SAW SGTL 13.0X1.19X90.0M (BLADE) ×2 IMPLANT
BNDG CMPR 9X6 STRL LF SNTH (GAUZE/BANDAGES/DRESSINGS) ×1
BNDG ESMARK 6X9 LF (GAUZE/BANDAGES/DRESSINGS) ×2
CAP KNEE TOTAL 3 SIGMA ×1 IMPLANT
CEMENT HV SMART SET (Cement) ×2 IMPLANT
CUFF TOURN SGL QUICK 34 (TOURNIQUET CUFF) ×2
CUFF TRNQT CYL 34X4X40X1 (TOURNIQUET CUFF) ×1 IMPLANT
DECANTER SPIKE VIAL GLASS SM (MISCELLANEOUS) ×2 IMPLANT
DRAPE EXTREMITY T 121X128X90 (DRAPE) ×2 IMPLANT
DRAPE POUCH INSTRU U-SHP 10X18 (DRAPES) ×2 IMPLANT
DRAPE SHEET LG 3/4 BI-LAMINATE (DRAPES) ×2 IMPLANT
DRAPE U-SHAPE 47X51 STRL (DRAPES) ×2 IMPLANT
DRSG AQUACEL AG ADV 3.5X10 (GAUZE/BANDAGES/DRESSINGS) ×2 IMPLANT
DRSG TEGADERM 4X4.75 (GAUZE/BANDAGES/DRESSINGS) ×2 IMPLANT
DURAPREP 26ML APPLICATOR (WOUND CARE) ×2 IMPLANT
ELECT REM PT RETURN 9FT ADLT (ELECTROSURGICAL) ×2
ELECTRODE REM PT RTRN 9FT ADLT (ELECTROSURGICAL) ×1 IMPLANT
EVACUATOR 1/8 PVC DRAIN (DRAIN) ×2 IMPLANT
FACESHIELD WRAPAROUND (MASK) ×10 IMPLANT
FACESHIELD WRAPAROUND OR TEAM (MASK) ×5 IMPLANT
GAUZE SPONGE 2X2 8PLY STRL LF (GAUZE/BANDAGES/DRESSINGS) ×1 IMPLANT
GLOVE BIOGEL PI IND STRL 8 (GLOVE) ×2 IMPLANT
GLOVE BIOGEL PI INDICATOR 8 (GLOVE) ×2
GLOVE SURG ORTHO 8.0 STRL STRW (GLOVE) ×2 IMPLANT
GLOVE SURG ORTHO 9.0 STRL STRW (GLOVE) ×2 IMPLANT
GLOVE SURG SS PI 7.5 STRL IVOR (GLOVE) ×2 IMPLANT
GOWN STRL REUS W/TWL XL LVL3 (GOWN DISPOSABLE) ×4 IMPLANT
HANDPIECE INTERPULSE COAX TIP (DISPOSABLE) ×2
IMMOBILIZER KNEE 20 (SOFTGOODS) ×1 IMPLANT
IMMOBILIZER KNEE 20 THIGH 36 (SOFTGOODS) ×1 IMPLANT
KIT BASIN OR (CUSTOM PROCEDURE TRAY) ×2 IMPLANT
LIQUID BAND (GAUZE/BANDAGES/DRESSINGS) ×2 IMPLANT
NDL SAFETY ECLIPSE 18X1.5 (NEEDLE) ×1 IMPLANT
NEEDLE HYPO 18GX1.5 SHARP (NEEDLE) ×2
NS IRRIG 1000ML POUR BTL (IV SOLUTION) ×2 IMPLANT
PACK TOTAL JOINT (CUSTOM PROCEDURE TRAY) ×2 IMPLANT
PEN SKIN MARKING BROAD (MISCELLANEOUS) ×2 IMPLANT
POSITIONER SURGICAL ARM (MISCELLANEOUS) ×2 IMPLANT
SET HNDPC FAN SPRY TIP SCT (DISPOSABLE) ×1 IMPLANT
SET PAD KNEE POSITIONER (MISCELLANEOUS) ×2 IMPLANT
SPONGE GAUZE 2X2 STER 10/PKG (GAUZE/BANDAGES/DRESSINGS) ×1
SPONGE LAP 18X18 X RAY DECT (DISPOSABLE) IMPLANT
SPONGE SURGIFOAM ABS GEL 100 (HEMOSTASIS) ×2 IMPLANT
STOCKINETTE 6  STRL (DRAPES) ×1
STOCKINETTE 6 STRL (DRAPES) ×1 IMPLANT
SUCTION FRAZIER 12FR DISP (SUCTIONS) ×2 IMPLANT
SUT BONE WAX W31G (SUTURE) IMPLANT
SUT MNCRL AB 3-0 PS2 18 (SUTURE) ×2 IMPLANT
SUT VIC AB 1 CT1 27 (SUTURE) ×8
SUT VIC AB 1 CT1 27XBRD ANTBC (SUTURE) ×4 IMPLANT
SUT VIC AB 2-0 CT1 27 (SUTURE) ×4
SUT VIC AB 2-0 CT1 TAPERPNT 27 (SUTURE) ×2 IMPLANT
SUT VLOC 180 0 24IN GS25 (SUTURE) ×2 IMPLANT
SYR 50ML LL SCALE MARK (SYRINGE) ×2 IMPLANT
TAPE STRIPS DRAPE STRL (GAUZE/BANDAGES/DRESSINGS) ×2 IMPLANT
TOWEL OR 17X26 10 PK STRL BLUE (TOWEL DISPOSABLE) ×2 IMPLANT
TOWEL OR NON WOVEN STRL DISP B (DISPOSABLE) IMPLANT
TOWER CARTRIDGE SMART MIX (DISPOSABLE) ×2 IMPLANT
TRAY FOLEY CATH 16FRSI W/METER (SET/KITS/TRAYS/PACK) ×1 IMPLANT
TRAY FOLEY W/METER SILVER 14FR (SET/KITS/TRAYS/PACK) ×1 IMPLANT
WATER STERILE IRR 1500ML POUR (IV SOLUTION) ×4 IMPLANT
WRAP KNEE MAXI GEL POST OP (GAUZE/BANDAGES/DRESSINGS) ×2 IMPLANT
YANKAUER SUCT BULB TIP NO VENT (SUCTIONS) ×2 IMPLANT

## 2015-05-15 NOTE — Anesthesia Procedure Notes (Signed)
Spinal Patient location during procedure: OR Start time: 05/15/2015 1:07 PM End time: 05/15/2015 1:14 PM Staffing Resident/CRNA: Sherian Maroon A Performed by: resident/CRNA  Preanesthetic Checklist Completed: patient identified, site marked, surgical consent, pre-op evaluation, timeout performed, IV checked, risks and benefits discussed and monitors and equipment checked Spinal Block Patient position: sitting Prep: Betadine Patient monitoring: heart rate, cardiac monitor, continuous pulse ox and blood pressure Approach: midline Location: L4-5 Needle Needle type: Sprotte  Needle gauge: 24 G Needle length: 9 cm Needle insertion depth: 5 cm Assessment Sensory level: T10

## 2015-05-15 NOTE — Interval H&P Note (Signed)
History and Physical Interval Note:  05/15/2015 12:48 PM  Marc Garcia  has presented today for surgery, with the diagnosis of right knee osteoarthritis  The various methods of treatment have been discussed with the patient and family. After consideration of risks, benefits and other options for treatment, the patient has consented to  Procedure(s): RIGHT TOTAL KNEE ARTHROPLASTY (Right) as a surgical intervention .  The patient's history has been reviewed, patient examined, no change in status, stable for surgery.  I have reviewed the patient's chart and labs.  Questions were answered to the patient's satisfaction.     [name] ANDREW

## 2015-05-15 NOTE — Transfer of Care (Signed)
Immediate Anesthesia Transfer of Care Note  Patient: Marc Garcia  Procedure(s) Performed: Procedure(s): RIGHT TOTAL KNEE ARTHROPLASTY (Right)  Patient Location: PACU  Anesthesia Type:Spinal  Level of Consciousness:  sedated, patient cooperative and responds to stimulation  Airway & Oxygen Therapy:Patient Spontanous Breathing and Patient connected to face mask oxgen  Post-op Assessment:  Report given to PACU RN and Post -op Vital signs reviewed and stable  Post vital signs:  Reviewed and stable  Last Vitals:  Filed Vitals:   05/15/15 1512  BP: 118/73  Pulse:   Temp:   Resp:     Complications: No apparent anesthesia complications, L2 on exam denied pain.

## 2015-05-15 NOTE — Anesthesia Postprocedure Evaluation (Signed)
  Anesthesia Post-op Note  Patient: Marc Garcia  Procedure(s) Performed: Procedure(s) (LRB): RIGHT TOTAL KNEE ARTHROPLASTY (Right)  Patient Location: PACU  Anesthesia Type: Spinal  Level of Consciousness: awake and alert   Airway and Oxygen Therapy: Patient Spontanous Breathing  Post-op Pain: mild  Post-op Assessment: Post-op Vital signs reviewed, Patient's Cardiovascular Status Stable, Respiratory Function Stable, Patent Airway and No signs of Nausea or vomiting  Last Vitals:  Filed Vitals:   05/15/15 1709  BP: 128/83  Pulse: 93  Temp: 36.8 C  Resp:     Post-op Vital Signs: stable   Complications: No apparent anesthesia complications

## 2015-05-15 NOTE — Op Note (Signed)
DATE OF SURGERY:  7/15/20162  TIME: 2:58 PM  PATIENT NAME:  Marc Garcia    AGE: 59 y.o.   PRE-OPERATIVE DIAGNOSIS:  right knee osteoarthritis  POST-OPERATIVE DIAGNOSIS:  right knee osteoarthritis  PROCEDURE:  Procedure(s): RIGHT TOTAL KNEE ARTHROPLASTY  SURGEON:  Annastyn Silvey ANDREW  ASSISTANT:  Bryson Stilwell, PA-C, present and scrubbed throughout the case, critical for assistance with exposure, retraction, instrumentation, and closure.  OPERATIVE IMPLANTS: Depuy PFC Sigma Rotating Platform.  Femur size 4, Tibia size 4, Patella size 38 3-peg oval button, with a 12.5 mm polyethylene insert.   PREOPERATIVE INDICATIONS:   Marc OKANE is a 59 y.o. year old male with end stage bone on bone arthritis of the knee who failed conservative treatment and elected for Total Knee Arthroplasty.   The risks, benefits, and alternatives were discussed at length including but not limited to the risks of infection, bleeding, nerve injury, stiffness, blood clots, the need for revision surgery, cardiopulmonary complications, among others, and they were willing to proceed.  OPERATIVE DESCRIPTION:  The patient was brought to the operative room and placed in a supine position.  Spinal anesthesia was administered.  IV antibiotics were given.  The lower extremity was prepped and draped in the usual sterile fashion.  Time out was performed.  The leg was elevated and exsanguinated and the tourniquet was inflated.  Anterior quadriceps tendon splitting approach was performed.  The patella was retracted and osteophytes were removed.  The anterior horn of the medial and lateral meniscus was removed and cruciate ligaments resected.   The distal femur was opened with the drill and the intramedullary distal femoral cutting jig was utilized, set at 5 degrees resecting 10 mm off the distal femur.  Care was taken to protect the collateral ligaments.  The distal femoral sizing jig was applied, taking care to  avoid notching.  Then the 4-in-1 cutting jig was applied and the anterior and posterior femur was cut, along with the chamfer cuts.    Then the extramedullary tibial cutting jig was utilized making the appropriate cut using the anterior tibial crest as a reference building in appropriate posterior slope.  Care was taken during the cut to protect the medial and collateral ligaments.  The proximal tibia was removed along with the posterior horns of the menisci.   The posterior medial femoral osteophytes and posterior lateral femoral osteophytes were removed.    The flexion gap was then measured and was symmetric with the extension gap, measured at 12.  I completed the distal femoral preparation using the appropriate jig to prepare the box.  The patella was then measured, and cut with the saw.    The proximal tibia sized and prepared accordingly with the reamer and the punch, and then all components were trialed with the trial insert.  The knee was found to have excellent balance and full motion.    The above named components were then cemented into place and all excess cement was removed.  The trial polyethylene component was in place during cementation, and then was exchanged for the real polyethylene component.    The knee was easily taken through a range of motion and the patella tracked well and the knee irrigated copiously and the parapatellar and subcutaneous tissue closed with vicryl, and monocryl with steri strips for the skin.  The arthrotomy was closed at 90 of flexion. The wounds were dressed with sterile gauze and the tourniquet released and the patient was awakened and returned to the PACU  in stable and satisfactory condition.  There were no complications.  Total tourniquet time was 90 minutes.Vloc closure .60cc saline toradol marcaine periosteal injection.

## 2015-05-15 NOTE — Anesthesia Preprocedure Evaluation (Addendum)
Anesthesia Evaluation  Patient identified by MRN, date of birth, ID band Patient awake    Reviewed: Allergy & Precautions, H&P , NPO status , Patient's Chart, lab work & pertinent test results  Airway Mallampati: II  TM Distance: >3 FB Neck ROM: full    Dental no notable dental hx. (+) Dental Advisory Given, Teeth Intact   Pulmonary COPDformer smoker,  Mild emphysema breath sounds clear to auscultation  Pulmonary exam normal       Cardiovascular Exercise Tolerance: Good hypertension, Pt. on medications Normal cardiovascular examRhythm:regular Rate:Normal     Neuro/Psych negative neurological ROS  negative psych ROS   GI/Hepatic negative GI ROS, Neg liver ROS, GERD-  Medicated and Controlled,  Endo/Other  negative endocrine ROSBorderline diabetic  Renal/GU negative Renal ROS  negative genitourinary   Musculoskeletal   Abdominal   Peds  Hematology negative hematology ROS (+)   Anesthesia Other Findings   Reproductive/Obstetrics negative OB ROS                            Anesthesia Physical Anesthesia Plan  ASA: III  Anesthesia Plan: Spinal   Post-op Pain Management:    Induction:   Airway Management Planned: Simple Face Mask  Additional Equipment:   Intra-op Plan:   Post-operative Plan:   Informed Consent: I have reviewed the patients History and Physical, chart, labs and discussed the procedure including the risks, benefits and alternatives for the proposed anesthesia with the patient or authorized representative who has indicated his/her understanding and acceptance.   Dental Advisory Given  Plan Discussed with: CRNA and Surgeon  Anesthesia Plan Comments:         Anesthesia Quick Evaluation

## 2015-05-16 LAB — BASIC METABOLIC PANEL
Anion gap: 7 (ref 5–15)
BUN: 16 mg/dL (ref 6–20)
CHLORIDE: 105 mmol/L (ref 101–111)
CO2: 25 mmol/L (ref 22–32)
Calcium: 8.7 mg/dL — ABNORMAL LOW (ref 8.9–10.3)
Creatinine, Ser: 1.03 mg/dL (ref 0.61–1.24)
GFR calc Af Amer: >60 mL/min (ref >60)
GFR calc non Af Amer: >60 mL/min (ref >60)
Glucose, Bld: 160 mg/dL — ABNORMAL HIGH (ref 65–99)
Potassium: 4.3 mmol/L (ref 3.5–5.1)
Sodium: 137 mmol/L (ref 135–145)

## 2015-05-16 LAB — CBC
HEMATOCRIT: 34.7 % — AB (ref 39.0–52.0)
Hemoglobin: 11.7 g/dL — ABNORMAL LOW (ref 13.0–17.0)
MCH: 28.1 pg (ref 26.0–34.0)
MCHC: 33.7 g/dL (ref 30.0–36.0)
MCV: 83.4 fL (ref 78.0–100.0)
Platelets: 157 10*3/uL (ref 150–400)
RBC: 4.16 MIL/uL — AB (ref 4.22–5.81)
RDW: 13.6 % (ref 11.5–15.5)
WBC: 12.9 10*3/uL — ABNORMAL HIGH (ref 4.0–10.5)

## 2015-05-16 NOTE — Progress Notes (Signed)
Physical Therapy Treatment Note    05/16/15 1400  PT Visit Information  Last PT Received On 05/16/15  Assistance Needed +1  History of Present Illness Pt is a 59 year old male s/p R TKA  PT Time Calculation  PT Start Time (ACUTE ONLY) 1309  PT Stop Time (ACUTE ONLY) 1318  PT Time Calculation (min) (ACUTE ONLY) 9 min  Subjective Data  Subjective Pt ambulated in hallway again and practiced a couple steps.  Precautions  Precautions Knee  Required Braces or Orthoses Knee Immobilizer - Right  Restrictions  Other Position/Activity Restrictions WBAT  Pain Assessment  Pain Assessment 0-10  Pain Score 2  Pain Location R knee  Pain Descriptors / Indicators Sore  Pain Intervention(s) Monitored during session;Limited activity within patient's tolerance;Repositioned  Cognition  Arousal/Alertness Awake/alert  Behavior During Therapy WFL for tasks assessed/performed  Overall Cognitive Status Within Functional Limits for tasks assessed  Bed Mobility  Overal bed mobility Needs Assistance  Bed Mobility Sit to Supine  Sit to supine Supervision  Transfers  Overall transfer level Needs assistance  Equipment used Rolling [place] (2 wheeled)  Transfers Sit to/from Stand  Sit to Stand Supervision  General transfer comment verbal cues for safe technique  Ambulation/Gait  Ambulation/Gait assistance Min guard;Supervision  Ambulation Distance (Feet) 160 Feet  Assistive device Rolling [place] (2 wheeled)  Gait Pattern/deviations Step-through pattern;Step-to pattern;Antalgic;Decreased step length - left  General Gait Details verbal cues for sequence, step length, RW positioning, posture  Stairs Yes  Stairs assistance Min guard  Stair Management One rail Left;Step to pattern;Forwards  Number of Stairs 2  General stair comments verbal cues for safety and sequence, performed twice, spouse observed  PT - End of Session  Activity Tolerance Patient tolerated treatment well  Patient left with call  bell/phone within reach;in bed;with family/visitor present  PT - Assessment/Plan  PT Plan Current plan remains appropriate  PT Frequency (ACUTE ONLY) 7X/week  Follow Up Recommendations Home health PT  PT equipment Rolling [place] with 5" wheels  PT Goal Progression  Progress towards PT goals Progressing toward goals  PT General Charges  $$ ACUTE PT VISIT 1 Procedure  PT Treatments  $Gait Training 8-22 mins   Carmelia Bake, PT, DPT 05/16/2015 Pager: [phone]

## 2015-05-16 NOTE — Progress Notes (Signed)
OT  Note  Patient Details Name: KRISTOF NADEEM MRN: [id number] DOB: October 17, 1956   Cancelled Treatment:    Reason Eval/Treat Not Completed: OT screened, no needs identified, will sign off  Weakley, Thereasa Parkin 05/16/2015, 3:25 PM

## 2015-05-16 NOTE — Care Management Note (Addendum)
Case Management Note  Patient Details  Name: Marc Garcia MRN: [id number] Date of Birth: 12-06-1955  Subjective/Objective:                  RIGHT TOTAL KNEE ARTHROPLASTY (Right)  Action/Plan: Discharge planning  Expected Discharge Date:                  Expected Discharge Plan:  [place]  In-House Referral:     Discharge planning Services  CM Consult  Post Acute Care Choice:  Home Health Choice offered to:  Patient  DME Arranged:  [place] rolling DME Agency:  [place]:  PT [facility] Agency:  [place]  Status of Service:  Completed, signed off  Medicare Important Message Given:    Date Medicare IM Given:    Medicare IM give by:    Date Additional Medicare IM Given:    Additional Medicare Important Message give by:     If discussed at [place] of Stay Meetings, dates discussed:    Additional Comments:  CM spoke with patient via telephone. Patient selected [facility] for HHPT. Tiffany at [facility] notified of the referral. Patient needs a RW. She has a 3N1 at home. Jermaine at [facility] notified of DME request and will deliver to patient's room. Patient will have the assistance of her husband at home.   Apolonio Schneiders, RN 05/16/2015, 10:14 AM

## 2015-05-16 NOTE — Progress Notes (Signed)
UR Completed. Jawaun Celmer, RN, BSN.  [phone] 

## 2015-05-16 NOTE — Evaluation (Signed)
Physical Therapy Evaluation Patient Details Name: Marc Garcia MRN: [id number] DOB: 1956/04/18 Today's Date: 05/16/2015   History of Present Illness  Pt is a 59 year old male s/p R TKA  Clinical Impression  Pt is s/p R TKA resulting in the deficits listed below (see PT Problem List).  Pt will benefit from skilled PT to increase their independence and safety with mobility to allow discharge to the venue listed below.  Pt mobilizing well POD #1 and plans to d/c home tomorrow with spouse.  Will need to practice 6 steps prior to d/c.     Follow Up Recommendations Home health PT    Equipment Recommendations  Rolling [place] with 5" wheels    Recommendations for Other Services       Precautions / Restrictions Precautions Precautions: Knee Required Braces or Orthoses: Knee Immobilizer - Right Restrictions Other Position/Activity Restrictions: WBAT      Mobility  Bed Mobility Overal bed mobility: Needs Assistance Bed Mobility: Supine to Sit     Supine to sit: Supervision;HOB elevated        Transfers Overall transfer level: Needs assistance Equipment used: Rolling [place] (2 wheeled) Transfers: Sit to/from Stand Sit to Stand: Min guard         General transfer comment: verbal cues for safe technique  Ambulation/Gait Ambulation/Gait assistance: Min guard Ambulation Distance (Feet): 120 Feet Assistive device: Rolling [place] (2 wheeled) Gait Pattern/deviations: Step-to pattern;Antalgic     General Gait Details: verbal cues for sequence, step length, RW positioning, posture  Stairs            Wheelchair Mobility    Modified Rankin (Stroke Patients Only)       Balance                                             Pertinent Vitals/Pain Pain Assessment: 0-10 Pain Score: 3  Pain Location: R knee Pain Descriptors / Indicators: Aching;Sore Pain Intervention(s): Limited activity within patient's tolerance;Monitored during  session;Premedicated before session;Repositioned;Ice applied    Home Living Family/patient expects to be discharged to:: Private residence Living Arrangements: Spouse/significant other   Type of Home: House Home Access: Stairs to enter Entrance Stairs-Rails: Chemical engineer of Steps: 6 Home Layout: Able to live on main level with bedroom/bathroom Home Equipment: Toilet riser      Prior Function Level of Independence: Independent               Hand Dominance        Extremity/Trunk Assessment   Upper Extremity Assessment: LUE deficits/detail       LUE Deficits / Details: limited shoulder active ROM   Lower Extremity Assessment: RLE deficits/detail RLE Deficits / Details: good quad contraction, able to perform SLR, AAROM knee flexion approx 85* during heel slides       Communication   Communication: No difficulties  Cognition Arousal/Alertness: Awake/alert Behavior During Therapy: WFL for tasks assessed/performed Overall Cognitive Status: Within Functional Limits for tasks assessed                      General Comments      Exercises Total Joint Exercises Ankle Circles/Pumps: AROM;Both;15 reps Quad Sets: AROM;Both;15 reps Short Arc Quad: AROM;Right;15 reps Heel Slides: AAROM;Right;15 reps Hip ABduction/ADduction: AROM;Right;15 reps Straight Leg Raises: 10 reps;AROM;Right      Assessment/Plan    PT Assessment  Patient needs continued PT services  PT Diagnosis Difficulty walking;Acute pain   PT Problem List Decreased strength;Decreased range of motion;Decreased mobility;Pain  PT Treatment Interventions Functional mobility training;Stair training;DME instruction;Gait training;Patient/family education;Therapeutic activities;Therapeutic exercise   PT Goals (Current goals can be found in the Care Plan section) Acute Rehab PT Goals PT Goal Formulation: With patient Time For Goal Achievement: 05/20/15 Potential to Achieve Goals:  Good    Frequency 7X/week   Barriers to discharge        Co-evaluation               End of Session Equipment Utilized During Treatment: Gait belt;Right knee immobilizer Activity Tolerance: Patient tolerated treatment well Patient left: in chair;with call bell/phone within reach;with family/visitor present           Time: 4388-8757 PT Time Calculation (min) (ACUTE ONLY): 22 min   Charges:   PT Evaluation $Initial PT Evaluation Tier I: 1 Procedure     PT G Codes:        Levina Boyack,KATHrine E 05/16/2015, 12:50 PM Carmelia Bake, PT, DPT 05/16/2015 Pager: [phone]

## 2015-05-16 NOTE — Progress Notes (Signed)
Subjective: 1 Day Post-Op Procedure(s) (LRB): RIGHT TOTAL KNEE ARTHROPLASTY (Right)  Patient reports pain as mild to moderate.  Denies fever, chills, N/V.  Resting comfortably in bed getting ready to eat breakfast.  Notes that he was successfully up with therapy yesterday.  Denies BM or flatulence at this time.  Objective:   VITALS:  Temp:  [97.8 F (36.6 C)-98.9 F (37.2 C)] 97.9 F (36.6 C) (07/16 0506) Pulse Rate:  [82-105] 85 (07/16 0506) Resp:  [11-18] 16 (07/16 0506) BP: (104-130)/(44-101) 104/64 mmHg (07/16 0506) SpO2:  [95 %-100 %] 97 % (07/16 0506) Weight:  [87.998 kg (194 lb)] 87.998 kg (194 lb) (07/15 1048)  Neurologically intact ABD soft Neurovascular intact Sensation intact distally Intact pulses distally Dorsiflexion/Plantar flexion intact No cellulitis present Dressing C/D/I.  Drain pulled that resulted in mild bleeding, reinforced with guaze and rewrapped with ACE.   LABS  Recent Labs  05/16/15 0445  HGB 11.7*  WBC 12.9*  PLT 157    Recent Labs  05/16/15 0445  NA 137  K 4.3  CL 105  CO2 25  BUN 16  CREATININE 1.03  GLUCOSE 160*   No results for input(s): LABPT, INR in the last 72 hours.   Assessment/Plan: 1 Day Post-Op Procedure(s) (LRB): RIGHT TOTAL KNEE ARTHROPLASTY (Right)  Up with therapy D/C IV fluids Plan for discharge tomorrow to home. Patient has f/u appointment with Dr. Theda Sers in 2 weeks in the office.  Mechele Claude, PA-C, ATC [employer] Office:  [phone]

## 2015-05-17 LAB — CBC
HCT: 31.6 % — ABNORMAL LOW (ref 39.0–52.0)
HEMOGLOBIN: 10.4 g/dL — AB (ref 13.0–17.0)
MCH: 27.5 pg (ref 26.0–34.0)
MCHC: 32.9 g/dL (ref 30.0–36.0)
MCV: 83.6 fL (ref 78.0–100.0)
Platelets: 157 10*3/uL (ref 150–400)
RBC: 3.78 MIL/uL — AB (ref 4.22–5.81)
RDW: 14 % (ref 11.5–15.5)
WBC: 14.6 10*3/uL — ABNORMAL HIGH (ref 4.0–10.5)

## 2015-05-17 NOTE — Progress Notes (Signed)
Physical Therapy Treatment Patient Details Name: Marc Garcia MRN: [id number] DOB: 30-Apr-1956 Today's Date: 2015/06/08    History of Present Illness Pt is a 59 year old male s/p R TKA    PT Comments    Excellent progress; R knee flexion ~ 90* AAROM  Follow Up Recommendations  Home health PT     Equipment Recommendations  Rolling Gest with 5" wheels    Recommendations for Other Services       Precautions / Restrictions Precautions Precautions: Knee Precaution Comments: I SLR today, KI not used Required Braces or Orthoses: Knee Immobilizer - Right Restrictions Other Position/Activity Restrictions: WBAT    Mobility  Bed Mobility Overal bed mobility: Needs Assistance Bed Mobility: Supine to Sit       Sit to supine: Modified independent (Device/Increase time)      Transfers   Equipment used: Rolling [place] (2 wheeled) Transfers: Sit to/from Stand Sit to Stand: Modified independent (Device/Increase time)         General transfer comment: pt uses correct hand placement  Ambulation/Gait Ambulation/Gait assistance: Supervision Ambulation Distance (Feet): 160 Feet Assistive device: Rolling [place] (2 wheeled) Gait Pattern/deviations: Step-through pattern;Antalgic;Decreased step length - left;Decreased weight shift to right     General Gait Details: verbal cues for sequence, step length, RW positioning, posture   Stairs            Wheelchair Mobility    Modified Rankin (Stroke Patients Only)       Balance                                    Cognition Arousal/Alertness: Awake/alert Behavior During Therapy: WFL for tasks assessed/performed Overall Cognitive Status: Within Functional Limits for tasks assessed                      Exercises Total Joint Exercises Ankle Circles/Pumps: AROM;Both;15 reps Quad Sets: AROM;Both;10 reps Short Arc Quad: AROM;Right;15 reps Heel Slides: AROM;AAROM;Right;10 reps Hip  ABduction/ADduction: AROM;Right;10 reps Straight Leg Raises: 10 reps;AROM;Right    General Comments        Pertinent Vitals/Pain Pain Assessment: 0-10 Pain Score: 4  Pain Location: R knee Pain Descriptors / Indicators: Aching Pain Intervention(s): Limited activity within patient's tolerance;Monitored during session;Premedicated before session;Ice applied;Repositioned    Home Living                      Prior Function            PT Goals (current goals can now be found in the care plan section) Acute Rehab PT Goals PT Goal Formulation: With patient Time For Goal Achievement: 05/20/15 Potential to Achieve Goals: Good Progress towards PT goals: Progressing toward goals    Frequency  7X/week    PT Plan Current plan remains appropriate    Co-evaluation             End of Session Equipment Utilized During Treatment: Gait belt Activity Tolerance: Patient tolerated treatment well Patient left: in chair;with call bell/phone within reach;with family/visitor present     Time: 1410-3013 PT Time Calculation (min) (ACUTE ONLY): 20 min  Charges:  $Gait Training: 8-22 mins                    G Codes:      Gwendy Boeder 08-Jun-2015, 9:41 AM

## 2015-05-17 NOTE — Progress Notes (Addendum)
Subjective: 2 Days Post-Op Procedure(s) (LRB): RIGHT TOTAL KNEE ARTHROPLASTY (Right) Patient reports pain as 3 on 0-10 scale.    Objective: Vital signs in last 24 hours: Temp:  [97.8 F (36.6 C)-98.4 F (36.9 C)] 97.8 F (36.6 C) (07/17 0515) Pulse Rate:  [84-88] 84 (07/17 0515) Resp:  [16] 16 (07/17 0515) BP: (109-129)/(60-70) 120/60 mmHg (07/17 0515) SpO2:  [97 %-98 %] 97 % (07/17 0515)  Intake/Output from previous day: 07/16 0701 - 07/17 0700 In: 1672.5 [P.O.:1320; I.V.:352.5] Out: 600 [Urine:600] Intake/Output this shift:     Recent Labs  05/16/15 0445 05/17/15 0506  HGB 11.7* 10.4*    Recent Labs  05/16/15 0445 05/17/15 0506  WBC 12.9* 14.6*  RBC 4.16* 3.78*  HCT 34.7* 31.6*  PLT 157 157    Recent Labs  05/16/15 0445  NA 137  K 4.3  CL 105  CO2 25  BUN 16  CREATININE 1.03  GLUCOSE 160*  CALCIUM 8.7*   No results for input(s): LABPT, INR in the last 72 hours.  Neurologically intact Sensation intact distally Dorsiflexion/Plantar flexion intact Incision: dressing C/D/I Compartment soft no DVT Dressing ace removed. Slight bloody drainage from HV site. Dressed. No sign of infection. WBC stress margination, decadron effect. No dysuria no cough. Assessment/Plan: 2 Days Post-Op Procedure(s) (LRB): RIGHT TOTAL KNEE ARTHROPLASTY (Right) Up with therapy Discharge home with home health  D/C instructions given  Gabrielle Mester C 05/17/2015, 7:56 AM

## 2015-05-17 NOTE — Clinical Social Work Note (Signed)
CSW received consult for SNF.    CSW reviewed medical record that reflected PT evaluation recommending Home Health PT  CSW reviewed medical record that reflected case manager set up home health needs for pt  CSW reviewed medical record that reflected MD note stating that pt was discharging home with HH/PT services  No CSW needs  If CSW needs arise please re consult  .Dede Query, LCSW [facility] Clinical Social Worker - Weekend Coverage cell #: [phone]

## 2015-05-18 ENCOUNTER — Encounter (HOSPITAL_COMMUNITY): Payer: Self-pay | Admitting: Specialist

## 2015-06-03 NOTE — Discharge Summary (Signed)
Physician Discharge Summary  Patient ID: Marc Garcia MRN: [id number] DOB/AGE: 1956/09/10 59 y.o.  Admit date: 05/15/2015 Discharge date: 06/03/2015  Admission Diagnoses: Right kneeOA  Discharge Diagnoses:  Active Problems:   Osteoarthritis of right knee   S/P knee replacement   Discharged Condition:good  Hospital Course:  Marc Garcia is a 59 y.o. who was admitted to [facility]. They were brought to the operating room on 05/15/2015 and underwent Procedure(s): RIGHT TOTAL KNEE ARTHROPLASTY.  Patient tolerated the procedure well and was later transferred to the recovery room and then to the orthopaedic floor for postoperative care.  They were given PO and IV analgesics for pain control following their surgery.  They were given 24 hours of postoperative antibiotics of  Anti-infectives    Start     Dose/Rate Route Frequency Ordered Stop   05/15/15 2000  ceFAZolin (ANCEF) IVPB 2 g/50 mL premix     2 g 100 mL/hr over 30 Minutes Intravenous Every 6 hours 05/15/15 1621 05/16/15 0138   05/15/15 1027  ceFAZolin (ANCEF) IVPB 2 g/50 mL premix     2 g 100 mL/hr over 30 Minutes Intravenous On call to O.R. 05/15/15 1027 05/15/15 1306     and started on DVT prophylaxis in the form of lovenox.   PT and OT were ordered for total joint protocol.  Discharge planning consulted to help with postop disposition and equipment needs.  Patient had a good night on the evening of surgery and started to get up OOB with therapy on day one.  Hemovac drain was pulled without difficulty.  Continued to work with therapy into day two.  Dressing was with normal limits.  The patient had progressed with therapy and meeting their goals. Patient was seen in rounds and was ready to go home.  Consults: n/a  Significant Diagnostic Studies: routine  Treatments: routine  Discharge Exam: Blood pressure 120/60, pulse 84, temperature 97.8 F (36.6 C), temperature source Oral, resp. rate 16, height 6' (1.829 m),  weight 87.998 kg (194 lb), SpO2 97 %. Well nourished. Alert and oriented x3. RRR, Lungs clear, BS x4. Abdomen soft and non tender. Right Calf soft and non tender. Right knee dressing C/D/I. No DVT signs. Compartment soft. No signs of infection.  Right LE grossly neurovascular intact.  Disposition: 06-Home-Health Care Svc  Discharge Instructions    Call MD / Call 911    Complete by:  As directed   If you experience chest pain or shortness of breath, CALL 911 and be transported to the hospital emergency room.  If you develope a fever above 101 F, pus (white drainage) or increased drainage or redness at the wound, or calf pain, call your surgeon's office.     Constipation Prevention    Complete by:  As directed   Drink plenty of fluids.  Prune juice may be helpful.  You may use a stool softener, such as Colace (over the counter) 100 mg twice a day.  Use MiraLax (over the counter) for constipation as needed.     Diet - low sodium heart healthy    Complete by:  As directed      Discharge instructions    Complete by:  As directed   INSTRUCTIONS AFTER JOINT REPLACEMENT   Remove items at home which could result in a fall. This includes throw rugs or furniture in walking pathways ICE to the affected joint every three hours while awake for 30 minutes at a time, for at least the  first 3-5 days, and then as needed for pain and swelling.  Continue to use ice for pain and swelling. You may notice swelling that will progress down to the foot and ankle.  This is normal after surgery.  Elevate your leg when you are not up walking on it.   Continue to use the breathing machine you got in the hospital (incentive spirometer) which will help keep your temperature down.  It is common for your temperature to cycle up and down following surgery, especially at night when you are not up moving around and exerting yourself.  The breathing machine keeps your lungs expanded and your temperature down.   DIET:  As you were  doing prior to hospitalization, we recommend a well-balanced diet.  DRESSING / WOUND CARE / SHOWERING   Leave dressing in place, may remove the drain site dressing on the side. Place a band aid and neosporin over the site. May shower.  ACTIVITY  Increase activity slowly as tolerated, but follow the weight bearing instructions below.   No driving for 6 weeks or until further direction given by your physician.  You cannot drive while taking narcotics.  No lifting or carrying greater than 10 lbs. until further directed by your surgeon. Avoid periods of inactivity such as sitting longer than an hour when not asleep. This helps prevent blood clots.  You may return to work once you are authorized by your doctor.     WEIGHT BEARING   As tolerated, unless directed otherwise. Use assistive devices for safety.     EXERCISES  Results after joint replacement surgery are often greatly improved when you follow the exercise, range of motion and muscle strengthening exercises prescribed by your doctor. Safety measures are also important to protect the joint from further injury. Any time any of these exercises cause you to have increased pain or swelling, decrease what you are doing until you are comfortable again and then slowly increase them. If you have problems or questions, call your caregiver or physical therapist for advice.   Rehabilitation is important following a joint replacement. After just a few days of immobilization, the muscles of the leg can become weakened and shrink (atrophy).  These exercises are designed to build up the tone and strength of the thigh and leg muscles and to improve motion. Often times heat used for twenty to thirty minutes before working out will loosen up your tissues and help with improving the range of motion but do not use heat for the first two weeks following surgery (sometimes heat can increase post-operative swelling).   These exercises can be done on a  training (exercise) mat, on the floor, on a table or on a bed. Use whatever works the best and is most comfortable for you.    Use music or television while you are exercising so that the exercises are a pleasant break in your day. This will make your life better with the exercises acting as a break in your routine that you can look forward to.   Perform all exercises about fifteen times, three times per day or as directed.  You should exercise both the operative leg and the other leg as well.    Exercises include:   Quad Sets - Tighten up the muscle on the front of the thigh (Quad) and hold for 5-10 seconds.   Straight Leg Raises - With your knee straight (if you were given a brace, keep it on), lift the leg to 60 degrees,  hold for 3 seconds, and slowly lower the leg.  Perform this exercise against resistance later as your leg gets stronger.  Leg Slides: Lying on your back, slowly slide your foot toward your buttocks, bending your knee up off the floor (only go as far as is comfortable). Then slowly slide your foot back down until your leg is flat on the floor again.  Angel Wings: Lying on your back spread your legs to the side as far apart as you can without causing discomfort.  Hamstring Strength:  Lying on your back, push your heel against the floor with your leg straight by tightening up the muscles of your buttocks.  Repeat, but this time bend your knee to a comfortable angle, and push your heel against the floor.  You may put a pillow under the heel to make it more comfortable if necessary.   A rehabilitation program following joint replacement surgery can speed recovery and prevent re-injury in the future due to weakened muscles. Contact your doctor or a physical therapist for more information on knee rehabilitation.    CONSTIPATION  Constipation is defined medically as fewer than three stools per week and severe constipation as less than one stool per week.  Even if you have a regular bowel  pattern at home, your normal regimen is likely to be disrupted due to multiple reasons following surgery.  Combination of anesthesia, postoperative narcotics, change in appetite and fluid intake all can affect your bowels.   YOU MUST use at least one of the following options; they are listed in order of increasing strength to get the job done.  They are all available over the counter, and you may need to use some, POSSIBLY even all of these options:    Drink plenty of fluids (prune juice may be helpful) and high fiber foods Colace 100 mg by mouth twice a day  Senokot for constipation as directed and as needed Dulcolax (bisacodyl), take with full glass of water  Miralax (polyethylene glycol) once or twice a day as needed.  If you have tried all these things and are unable to have a bowel movement in the first 3-4 days after surgery call either your surgeon or your primary doctor.    If you experience loose stools or diarrhea, hold the medications until you stool forms back up.  If your symptoms do not get better within 1 week or if they get worse, check with your doctor.  If you experience "the worst abdominal pain ever" or develop nausea or vomiting, please contact the office immediately for further recommendations for treatment.   ITCHING:  If you experience itching with your medications, try taking only a single pain pill, or even half a pain pill at a time.  You can also use Benadryl over the counter for itching or also to help with sleep.   TED HOSE STOCKINGS:  Use stockings on both legs until for at least 2 weeks or as directed by physician office. They may be removed at night for sleeping.  MEDICATIONS:  See your medication summary on the "After Visit Summary" that nursing will review with you.  You may have some home medications which will be placed on hold until you complete the course of blood thinner medication.  It is important for you to complete the blood thinner medication as  prescribed.  PRECAUTIONS:  If you experience chest pain or shortness of breath - call 911 immediately for transfer to the hospital emergency department.   If  you develop a fever greater that 101 F, purulent drainage from wound, increased redness or drainage from wound, foul odor from the wound/dressing, or calf pain - CONTACT YOUR SURGEON.                                                   FOLLOW-UP APPOINTMENTS:  If you do not already have a post-op appointment, please call the office [phone] for an appointment to be seen by your surgeon.  Guidelines for how soon to be seen are listed in your "After Visit Summary", but are typically between 1-4 weeks after surgery.  OTHER INSTRUCTIONS:   Knee Replacement:  Do not place pillow under knee, focus on keeping the knee straight while resting. CPM instructions: 0-90 degrees, 2 hours in the morning, 2 hours in the afternoon, and 2 hours in the evening. Place foam block, curve side up under heel at all times except when in CPM or when walking.  DO NOT modify, tear, cut, or change the foam block in any way.  MAKE SURE YOU:  Understand these instructions.  Get help right away if you are not doing well or get worse.    Thank you for letting us be a part of your medical care team.  It is a privilege we respect greatly.  We hope these instructions will help you stay on track for a fast and full recovery!     Do not put a pillow under the knee. Place it under the heel.    Complete by:  As directed      Increase activity slowly as tolerated    Complete by:  As directed             Medication List    STOP taking these medications        HYDROcodone-acetaminophen 7.5-325 MG per tablet  Commonly known as:  NORCO      TAKE these medications        aspirin EC 325 MG tablet  Take 1 tablet (325 mg total) by mouth 2 (two) times daily.     atorvastatin 10 MG tablet  Commonly known as:  LIPITOR  Take 10 mg by mouth every morning.     meloxicam 15  MG tablet  Commonly known as:  MOBIC  Take 15 mg by mouth every morning.     methocarbamol 500 MG tablet  Commonly known as:  ROBAXIN  Take 1 tablet (500 mg total) by mouth every 6 (six) hours as needed for muscle spasms.     olmesartan-hydrochlorothiazide 20-12.5 MG per tablet  Commonly known as:  BENICAR HCT  Take 1 tablet by mouth every morning.     oxyCODONE-acetaminophen 5-325 MG per tablet  Commonly known as:  ROXICET  Take 1-2 tablets by mouth every 4 (four) hours as needed for severe pain.     pantoprazole 40 MG tablet  Commonly known as:  PROTONIX  Take 40 mg by mouth every morning.     senna 8.6 MG Tabs tablet  Commonly known as:  SENOKOT  Take 2 tablets by mouth daily.           Follow-up Information    Follow up with [facility].   Why:  Home Health Physical Therapy   Contact information:   [address] [place] [address] [phone]  Follow up with Cynda Familia, MD. Schedule an appointment as soon as possible for a visit in 2 weeks.   Specialty:  Orthopedic Surgery   Contact information:   [address] [place] [address] [phone]       Signed: Lajean Manes 06/03/2015, 8:30 PM

## 2015-06-10 ENCOUNTER — Ambulatory Visit (HOSPITAL_COMMUNITY): Payer: Managed Care, Other (non HMO) | Attending: Specialist | Admitting: Physical Therapy

## 2015-06-10 DIAGNOSIS — R262 Difficulty in walking, not elsewhere classified: Secondary | ICD-10-CM | POA: Insufficient documentation

## 2015-06-10 DIAGNOSIS — Z96651 Presence of right artificial knee joint: Secondary | ICD-10-CM | POA: Insufficient documentation

## 2015-06-10 DIAGNOSIS — M25661 Stiffness of right knee, not elsewhere classified: Secondary | ICD-10-CM | POA: Insufficient documentation

## 2015-06-10 DIAGNOSIS — Z9889 Other specified postprocedural states: Secondary | ICD-10-CM | POA: Insufficient documentation

## 2015-06-10 DIAGNOSIS — M25561 Pain in right knee: Secondary | ICD-10-CM | POA: Insufficient documentation

## 2015-06-10 DIAGNOSIS — R29898 Other symptoms and signs involving the musculoskeletal system: Secondary | ICD-10-CM | POA: Insufficient documentation

## 2015-06-12 ENCOUNTER — Encounter (HOSPITAL_COMMUNITY): Payer: Managed Care, Other (non HMO)

## 2015-06-15 ENCOUNTER — Ambulatory Visit (HOSPITAL_COMMUNITY): Payer: Managed Care, Other (non HMO) | Admitting: Physical Therapy

## 2015-06-15 DIAGNOSIS — R29898 Other symptoms and signs involving the musculoskeletal system: Secondary | ICD-10-CM

## 2015-06-15 DIAGNOSIS — M25661 Stiffness of right knee, not elsewhere classified: Secondary | ICD-10-CM

## 2015-06-15 DIAGNOSIS — M25561 Pain in right knee: Secondary | ICD-10-CM | POA: Diagnosis present

## 2015-06-15 DIAGNOSIS — R262 Difficulty in walking, not elsewhere classified: Secondary | ICD-10-CM

## 2015-06-15 DIAGNOSIS — Z96651 Presence of right artificial knee joint: Secondary | ICD-10-CM

## 2015-06-15 DIAGNOSIS — Z9889 Other specified postprocedural states: Secondary | ICD-10-CM | POA: Diagnosis present

## 2015-06-15 NOTE — Patient Instructions (Signed)
KNEE: Flexion - Standing   Bend knee to move heel toward buttocks. Do not move thigh forward. _10__ reps per set, _2__ sets per day, 1 time per day. Hold onto a support.   ABDUCTION: Side-Lying (Active)   Lie on left side, top leg straight. Raise top leg as far as possible.  Complete __2_ sets of __10_ repetitions. Perform _1__ sessions per day.   Bridging   Slowly raise buttocks from floor, keeping stomach tight. Repeat __10__ times per set. Do __2__ sets per session. Do __1__ sessions per day.   Half Squat to Chair   Stand with feet shoulder width apart. Push buttocks backward and lower slowly, touching chair lightly and returning to standing position. Complete _1-2__ sets of __10_ repetitions. Perform _1__ sessions per day.

## 2015-06-15 NOTE — Therapy (Signed)
[facility] [place], [name], [address] Phone: [phone]   Fax:  [phone]  Physical Therapy Evaluation  Patient Details  Name: Marc Garcia MRN: [id number] Date of Birth: [birth year] Referring Provider:  Sydnee Cabal, MD  Encounter Date: 06/15/2015      PT End of Session - 06/15/15 1817    Visit Number 1   Number of Visits 10   Date for PT Re-Evaluation 07/13/15   Authorization Type [employer]   PT Start Time 1345   PT Stop Time 1420   PT Time Calculation (min) 35 min   Activity Tolerance Patient tolerated treatment well   Behavior During Therapy [facility] for tasks assessed/performed      Past Medical History  Diagnosis Date  . Borderline diabetic   . Hypertension   . Hyperlipidemia   . Emphysema of lung   . GERD (gastroesophageal reflux disease)   . Arthritis     Past Surgical History  Procedure Laterality Date  . Knee surgery Right     times 2  . Cystectomy    . Hernia repair      inguinal bilat   . Colonscopy     . Total knee arthroplasty Right 05/15/2015    Procedure: RIGHT TOTAL KNEE ARTHROPLASTY;  Surgeon: Sydnee Cabal, MD;  Location: [facility];  Service: Orthopedics;  Laterality: Right;    There were no vitals filed for this visit.  Visit Diagnosis:  Status post total right knee replacement  Difficulty walking  Right knee pain  Knee stiffness, right  Right leg weakness      Subjective Assessment - 06/15/15 1351    Subjective Pt presents to PT following R TKA 05/15/15. He reports that his biggest concern is that his knee is popping on the medial side, and it aches a lot at night. He is having difficulty with ascending stairs with reciprocal pattern. He has been ambulating without AD for the past 2 weeks, and feels pretty steady without any AD.    How long can you sit comfortably? no limitations   How long can you stand comfortably? 10 minutes   How long can you walk comfortably? 10 minutes    Patient Stated Goals return to PLOF, be able to walk/climb stairs without knee pain   Currently in Pain? Yes   Pain Score 2    Pain Location Knee   Pain Orientation Right            [facility] PT Assessment - 06/15/15 0001    Assessment   Medical Diagnosis R TKA   Onset Date/Surgical Date 05/15/15   Next MD Visit 2 weeks   Prior Therapy yes- [facility]   Balance Screen   Has the patient fallen in the past 6 months No   Has the patient had a decrease in activity level because of a fear of falling?  No   Is the patient reluctant to leave their home because of a fear of falling?  No   Home Environment   Living Environment Private residence   Living Arrangements Spouse/significant other   Type of [place] to enter   Entrance Stairs-Number of Steps [place] or work area in basement;Two level   Alternate Level Stairs-Number of Steps 15   Alternate Level Stairs-Rails Right;Left   Prior Function   Level of Independence Independent   Vocation --  will retire at the  end of the year   Observation/Other Assessments   Focus on Therapeutic Outcomes (FOTO)  63% limited   Functional Tests   Functional tests Single leg stance   Single Leg Stance   Comments 4" on R   ROM / Strength   AROM / PROM / Strength AROM;PROM;Strength   AROM   AROM Assessment Site Knee   Right/Left Knee Right;Left   Right Knee Extension -8   Right Knee Flexion 110   Left Knee Extension 0   Left Knee Flexion 135   PROM   PROM Assessment Site Hip   Right/Left Hip Right;Left   Right Hip External Rotation  40   Right Hip Internal Rotation  40   Left Hip External Rotation  40   Left Hip Internal Rotation  40   Right/Left Knee Right;Left   Strength   Strength Assessment Site Hip;Knee;Ankle   Right/Left Hip Right;Left   Right Hip Flexion 4/5   Right Hip Extension 3-/5   Right Hip ABduction 4/5   Left Hip Flexion 4+/5   Left Hip Extension 3-/5    Left Hip ABduction 4/5   Right/Left Knee Right;Left   Right Knee Flexion 4-/5   Right Knee Extension 4-/5   Left Knee Flexion 4/5   Left Knee Extension 4+/5   Right/Left Ankle --   Transfers   Five time sit to stand comments  13.15"   Ambulation/Gait   Ambulation/Gait Yes   Ambulation/Gait Assistance 6: Modified independent (Device/Increase time)   Ambulation Distance (Feet) 50 Feet   Gait Pattern Decreased stance time - right;Decreased step length - left   Gait Comments TUG: 8.85"                PT Education - 06/15/15 1817    Education provided Yes   Education Details HEP given, POC discussed   Person(s) Educated Patient   Methods Explanation;Handout   Comprehension Verbalized understanding;Returned demonstration          PT Short Term Goals - 06/15/15 1820    PT SHORT TERM GOAL #1   Title Pt will be independent in HEP   Time 2   Period Weeks   Status New   PT SHORT TERM GOAL #2   Title Knee extension will improve to 0 degrees to improve knee extension during gait.    Time 2   Period Weeks   Status New   PT SHORT TERM GOAL #3   Title Improve knee flexion to 115 to improve ability to ascend/descend stairs.    Time 2   Period Weeks   Status New   PT SHORT TERM GOAL #4   Title Improve quad, glut, and hamstring strength to 4/5 to improve gait mechancis and functional mobility.    Time 2   Period Weeks   Status New           PT Long Term Goals - 06/15/15 1823    PT LONG TERM GOAL #1   Title Improve R knee flexion to 120 degrees to allow pt to descned stairs with reciprocal pattern.    Time 4   Period Weeks   Status New   PT LONG TERM GOAL #2   Title Pt will complete five time sit to stand test in 11 seconds to demonstrate improved functional mobility and BLE strength.    Time 4   Period Weeks   Status New   PT LONG TERM GOAL #3   Title Pt will maintain SLS on RLE  x 15 seconds to demonstrate improved balance and decreased fall risk.    Time 4    Period Weeks   Status New               Plan - 06/15/15 1818    Clinical Impression Statement Pt presents to PT s/p R TKA, demonstrating impairments in knee ROM, BLE strength, gait mechanics, functional mobility, and functional activity tolerance. He will benefit from skilled physical therapy services to address these impairments in order to improve gait mechanics, increase strength, normalize ROM, and improve functional activity tolerance to return pt to PLOF.    Pt will benefit from skilled therapeutic intervention in order to improve on the following deficits Abnormal gait;Decreased activity tolerance;Decreased balance;Decreased endurance;Decreased range of motion;Decreased strength;Difficulty walking   Rehab Potential Good   PT Frequency 2x / week   PT Duration 4 weeks  4-6 weeks   PT Treatment/Interventions ADLs/Self Care Home Management;Cryotherapy;Gait training;Stair training;Functional mobility training;Therapeutic activities;Therapeutic exercise;Balance training;Neuromuscular re-education;Manual techniques;Scar mobilization   PT Next Visit Plan begin functional strengthening with squats, lunges, and step ups         Problem List Patient Active Problem List   Diagnosis Date Noted  . Osteoarthritis of right knee 05/15/2015  . S/P knee replacement 05/15/2015  . Lumbago 05/10/2011    Hilma Favors, PT, DPT [phone] 06/15/2015, 6:26 PM  [facility] [address] [place], [name], [address] Phone: [phone]   Fax:  [phone]

## 2015-06-17 ENCOUNTER — Ambulatory Visit (HOSPITAL_COMMUNITY): Payer: Managed Care, Other (non HMO) | Admitting: Physical Therapy

## 2015-06-17 DIAGNOSIS — Z96651 Presence of right artificial knee joint: Secondary | ICD-10-CM | POA: Diagnosis not present

## 2015-06-17 DIAGNOSIS — M25661 Stiffness of right knee, not elsewhere classified: Secondary | ICD-10-CM

## 2015-06-17 DIAGNOSIS — R29898 Other symptoms and signs involving the musculoskeletal system: Secondary | ICD-10-CM

## 2015-06-17 DIAGNOSIS — M25561 Pain in right knee: Secondary | ICD-10-CM

## 2015-06-17 DIAGNOSIS — R262 Difficulty in walking, not elsewhere classified: Secondary | ICD-10-CM

## 2015-06-17 NOTE — Patient Instructions (Signed)
Heel Raise: Bilateral (Standing)   Rise on balls of feet. Repeat _10x 3 x a day  http://orth.exer.us/38   Copyright  VHI. All rights reserved.  Functional Quadriceps: Chair Squat   Keeping feet flat on floor, shoulder width apart, squat as low as is comfortable. Use support as necessary. Repeat _10___ times per set. Do __1__ sets per session. Do _3___ sessions per day.  http://orth.exer.us/736   Copyright  VHI. All rights reserved.  Strengthening: Quadriceps Set   Tighten muscles on top of thighs by pushing knees down into surface. Hold _3-5___ seconds. Repeat ___10_ times per set. Do ___1_ sets per session. Do _3___ sessions per day.  http://orth.exer.us/602   Copyright  VHI. All rights reserved.  Knee Extension Mobilization: Towel Prop   With rolled towel under right ankle, place __2__ pound weight across knee. Hold _5-30___ minutes. Repeat ___1_ times per set. Do _1___ sets per session. Do __2__ sessions per day.  http://orth.exer.us/720   Copyright  VHI. All rights reserved.

## 2015-06-17 NOTE — Therapy (Signed)
[place], [name], [address] Phone: [phone]   Fax:  [phone]  Physical Therapy Treatment  Patient Details  Name: Marc Garcia MRN: [id number] Date of Birth: 1956/10/14 Referring Provider:  Sydnee Cabal, MD  Encounter Date: 06/17/2015      PT End of Session - 06/17/15 1141    Visit Number 2   Number of Visits 10   Date for PT Re-Evaluation 07/13/15   Authorization Type [employer]   PT Start Time 1103   PT Stop Time 1149   PT Time Calculation (min) 46 min   Activity Tolerance Patient tolerated treatment well   Behavior During Therapy [facility] for tasks assessed/performed      Past Medical History  Diagnosis Date  . Borderline diabetic   . Hypertension   . Hyperlipidemia   . Emphysema of lung   . GERD (gastroesophageal reflux disease)   . Arthritis     Past Surgical History  Procedure Laterality Date  . Knee surgery Right     times 2  . Cystectomy    . Hernia repair      inguinal bilat   . Colonscopy     . Total knee arthroplasty Right 05/15/2015    Procedure: RIGHT TOTAL KNEE ARTHROPLASTY;  Surgeon: Sydnee Cabal, MD;  Location: [facility];  Service: Orthopedics;  Laterality: Right;    There were no vitals filed for this visit.  Visit Diagnosis:  Status post total right knee replacement  Difficulty walking  Right knee pain  Knee stiffness, right  Right leg weakness      Subjective Assessment - 06/17/15 1106    Subjective Pt states he has no questions on his HEP; most difficulty thing is getting out of a chair.    Currently in Pain? Yes   Pain Score 2    Pain Orientation Right            [facility] Adult PT Treatment/Exercise - 06/17/15 0001    Exercises   Exercises Knee/Hip   Knee/Hip Exercises: Stretches   Active Hamstring Stretch Right;3 reps;30 seconds   Knee: Self-Stretch to increase Flexion Right;3 reps   Gastroc Stretch Both;3 reps;30 seconds   Gastroc Stretch Limitations  slant board    Knee/Hip Exercises: Aerobic   Stationary Bike x 8:00'   Knee/Hip Exercises: Standing   Heel Raises Both;10 reps   Knee Flexion Strengthening;Right;10 reps   Terminal Knee Extension Strengthening;Right;10 reps   Lateral Step Up Right;10 reps;Hand Hold: 2;Step Height: 4"   Forward Step Up Right;Hand Hold: 2;Step Height: 4"   Functional Squat 10 reps   Rocker Board 2 minutes   SLS x5 max RT 20" ; Lt stopped at 30"                   PT Short Term Goals - 06/17/15 1144    PT SHORT TERM GOAL #1   Title Pt will be independent in HEP   Baseline 12/10/2014-4 degrees   Time 2   Period Weeks   Status On-going   PT SHORT TERM GOAL #2   Title Knee extension will improve to 0 degrees to improve knee extension during gait.    Baseline on 12/10/2014 122 degrees    Time 2   Period Weeks   Status On-going   PT SHORT TERM GOAL #3   Title Improve knee flexion to 115 to improve ability to ascend/descend stairs.    Time 2   Period Weeks  Status On-going   PT SHORT TERM GOAL #4   Title Improve quad, glut, and hamstring strength to 4/5 to improve gait mechancis and functional mobility.    Time 2   Period Weeks   Status On-going   PT SHORT TERM GOAL #5   Title Patient will be indepent with HEP.    Time 3   Period Weeks   Status On-going           PT Long Term Goals - 06/17/15 1144    PT LONG TERM GOAL #1   Title Improve R knee flexion to 120 degrees to allow pt to descned stairs with reciprocal pattern.    Time 4   Period Weeks   Status On-going   PT LONG TERM GOAL #2   Title Pt will complete five time sit to stand test in 11 seconds to demonstrate improved functional mobility and BLE strength.    Time 4   Period Weeks   Status On-going   PT LONG TERM GOAL #3   Title Pt will maintain SLS on RLE x 15 seconds to demonstrate improved balance and decreased fall risk.    Time 4   Period Weeks   Status On-going               Plan - 06/17/15 1142     Clinical Impression Statement Advanced pt to balance and functional weight bearing strengthening exercises with therapist facilitation.  Pt doing very well only needing minimal verbal cuing for proper technique.     Pt will benefit from skilled therapeutic intervention in order to improve on the following deficits Abnormal gait;Decreased activity tolerance;Decreased balance;Decreased endurance;Decreased range of motion;Decreased strength;Difficulty walking   PT Treatment/Interventions ADLs/Self Care Home Management;Cryotherapy;Gait training;Stair training;Functional mobility training;Therapeutic activities;Therapeutic exercise;Balance training;Neuromuscular re-education;Manual techniques;Scar mobilization   PT Next Visit Plan begin prone quad stretch, prone terminal extension and prone contract relax to improve flexion.    Consulted and Agree with Plan of Care Patient        Problem List Patient Active Problem List   Diagnosis Date Noted  . Osteoarthritis of right knee 05/15/2015  . S/P knee replacement 05/15/2015  . Lumbago 05/10/2011   Rayetta Humphrey, PT CLT [phone] 06/17/2015, 11:45 AM  [place], [name], [address] Phone: [phone]   Fax:  [phone]

## 2015-06-18 ENCOUNTER — Ambulatory Visit (HOSPITAL_COMMUNITY): Payer: Managed Care, Other (non HMO) | Admitting: Physical Therapy

## 2015-06-18 DIAGNOSIS — R262 Difficulty in walking, not elsewhere classified: Secondary | ICD-10-CM

## 2015-06-18 DIAGNOSIS — R29898 Other symptoms and signs involving the musculoskeletal system: Secondary | ICD-10-CM

## 2015-06-18 DIAGNOSIS — M25661 Stiffness of right knee, not elsewhere classified: Secondary | ICD-10-CM

## 2015-06-18 DIAGNOSIS — Z96651 Presence of right artificial knee joint: Secondary | ICD-10-CM | POA: Diagnosis not present

## 2015-06-18 DIAGNOSIS — M25561 Pain in right knee: Secondary | ICD-10-CM

## 2015-06-18 NOTE — Therapy (Signed)
[place], [name], [address] Phone: [phone]   Fax:  [phone]  Physical Therapy Treatment  Patient Details  Name: Marc Garcia MRN: [id number] Date of Birth: 01-Dec-1955 Referring Provider:  Sharilyn Sites, MD  Encounter Date: 06/18/2015      PT End of Session - 06/18/15 1514    Visit Number 3   Number of Visits 10   Authorization Type [employer]   PT Start Time 2952   PT Stop Time 1523   PT Time Calculation (min) 48 min      Past Medical History  Diagnosis Date  . Borderline diabetic   . Hypertension   . Hyperlipidemia   . Emphysema of lung   . GERD (gastroesophageal reflux disease)   . Arthritis     Past Surgical History  Procedure Laterality Date  . Knee surgery Right     times 2  . Cystectomy    . Hernia repair      inguinal bilat   . Colonscopy     . Total knee arthroplasty Right 05/15/2015    Procedure: RIGHT TOTAL KNEE ARTHROPLASTY;  Surgeon: Sydnee Cabal, MD;  Location[facility];  Service: Orthopedics;  Laterality: Right;    There were no vitals filed for this visit.  Visit Diagnosis:  Status post total right knee replacement  Difficulty walking  Right knee pain  Knee stiffness, right  Right leg weakness      Subjective Assessment - 06/18/15 1436    Subjective Pt states that steps are getting easier for him.    Currently in Pain? Yes   Pain Score 2              [facility] Adult PT Treatment/Exercise - 06/18/15 0001    Exercises   Exercises Knee/Hip   Knee/Hip Exercises: Stretches   Active Hamstring Stretch Right;3 reps;30 seconds   Quad Stretch Right;3 reps;30 seconds   Knee: Self-Stretch to increase Flexion Right;3 reps   Gastroc Stretch Both;3 reps;30 seconds   Gastroc Stretch Limitations slant board    Knee/Hip Exercises: Aerobic   Stationary Bike x 8:00'   Knee/Hip Exercises: Standing   Heel Raises Both;10 reps   Heel Raises Limitations combined with squats no  hands    Knee Flexion Strengthening;Right;10 reps   Knee Flexion Limitations 5#   Terminal Knee Extension Strengthening;Right;15 reps   Lateral Step Up Right;15 reps;Hand Hold: 1;Step Height: 4"   Forward Step Up Right;15 reps;Hand Hold: 0;Step Height: 4"   Functional Squat 10 reps   Rocker Board 2 minutes   SLS x5 max RT 20" ; Lt stopped at 30"    Knee/Hip Exercises: Supine   Knee Extension --  4   Knee Flexion --  115   Knee/Hip Exercises: Prone   Contract/Relax to Increase Flexion x5   Other Prone Exercises terminal extension x 10    Other Prone Exercises PROM x 3               PT Short Term Goals - 06/17/15 1144    PT SHORT TERM GOAL #1   Title Pt will be independent in HEP   Baseline 12/10/2014-4 degrees   Time 2   Period Weeks   Status On-going   PT SHORT TERM GOAL #2   Title Knee extension will improve to 0 degrees to improve knee extension during gait.    Baseline on 12/10/2014 122 degrees    Time 2   Period Weeks  Status On-going   PT SHORT TERM GOAL #3   Title Improve knee flexion to 115 to improve ability to ascend/descend stairs.    Time 2   Period Weeks   Status On-going   PT SHORT TERM GOAL #4   Title Improve quad, glut, and hamstring strength to 4/5 to improve gait mechancis and functional mobility.    Time 2   Period Weeks   Status On-going   PT SHORT TERM GOAL #5   Title Patient will be indepent with HEP.    Time 3   Period Weeks   Status On-going           PT Long Term Goals - 06/17/15 1144    PT LONG TERM GOAL #1   Title Improve R knee flexion to 120 degrees to allow pt to descned stairs with reciprocal pattern.    Time 4   Period Weeks   Status On-going   PT LONG TERM GOAL #2   Title Pt will complete five time sit to stand test in 11 seconds to demonstrate improved functional mobility and BLE strength.    Time 4   Period Weeks   Status On-going   PT LONG TERM GOAL #3   Title Pt will maintain SLS on RLE x 15 seconds to  demonstrate improved balance and decreased fall risk.    Time 4   Period Weeks   Status On-going               Plan - 06/18/15 1515    Clinical Impression Statement Pt ROM improved to 4-115.  Pt able to go up and down steps in a reciprocal manner at this time although needs one hand hold.  Added prone exercises to improve extension and flexion.    PT Next Visit Plan begin vector stance; steps with no hand hold         Problem List Patient Active Problem List   Diagnosis Date Noted  . Osteoarthritis of right knee 05/15/2015  . S/P knee replacement 05/15/2015  . Lumbago 05/10/2011    Rayetta Humphrey, PT CLT [phone] 06/18/2015, 3:16 PM  [place] [address] [place], [name], [address] Phone: [phone]   Fax:  [phone]

## 2015-06-22 ENCOUNTER — Ambulatory Visit (HOSPITAL_COMMUNITY): Payer: Managed Care, Other (non HMO) | Admitting: Physical Therapy

## 2015-06-22 DIAGNOSIS — R262 Difficulty in walking, not elsewhere classified: Secondary | ICD-10-CM

## 2015-06-22 DIAGNOSIS — M25661 Stiffness of right knee, not elsewhere classified: Secondary | ICD-10-CM

## 2015-06-22 DIAGNOSIS — Z96651 Presence of right artificial knee joint: Secondary | ICD-10-CM

## 2015-06-22 DIAGNOSIS — M25561 Pain in right knee: Secondary | ICD-10-CM

## 2015-06-22 DIAGNOSIS — R29898 Other symptoms and signs involving the musculoskeletal system: Secondary | ICD-10-CM

## 2015-06-22 NOTE — Therapy (Signed)
[place], [name], [address] Phone: [phone]   Fax:  [phone]  Physical Therapy Treatment  Patient Details  Name: Marc Garcia MRN: [id number] Date of Birth: 1956/04/13 Referring Provider:  Sydnee Cabal, MD  Encounter Date: 06/22/2015      PT End of Session - 06/22/15 1049    Visit Number 4   Number of Visits 10   Date for PT Re-Evaluation 07/13/15   PT Start Time 1024   PT Stop Time 1106   PT Time Calculation (min) 42 min   Activity Tolerance Patient tolerated treatment well      Past Medical History  Diagnosis Date  . Borderline diabetic   . Hypertension   . Hyperlipidemia   . Emphysema of lung   . GERD (gastroesophageal reflux disease)   . Arthritis     Past Surgical History  Procedure Laterality Date  . Knee surgery Right     times 2  . Cystectomy    . Hernia repair      inguinal bilat   . Colonscopy     . Total knee arthroplasty Right 05/15/2015    Procedure: RIGHT TOTAL KNEE ARTHROPLASTY;  Surgeon: Sydnee Cabal, MD;  Location: [facility];  Service: Orthopedics;  Laterality: Right;    There were no vitals filed for this visit.  Visit Diagnosis:  Status post total right knee replacement  Difficulty walking  Right knee pain  Knee stiffness, right  Right leg weakness      Subjective Assessment - 06/22/15 1022    Subjective I think I broke my little toe on my Lt foot.  My knee does not seem to be aching as much.    Currently in Pain? Yes   Pain Score 2    Pain Location Knee   Pain Orientation Right            [facility] PT Assessment - 06/22/15 0001    AROM   Right Knee Extension 2   Right Knee Flexion 120   Strength   Right Hip Flexion 5/5   Right Hip Extension 3/5   Right Hip ABduction 5/5   Left Hip ABduction 5/5   Right Knee Flexion 5/5   Right Knee Extension 5/5                [facility] Adult PT Treatment/Exercise - 06/22/15 0001    Knee/Hip  Exercises: Stretches   Passive Hamstring Stretch 3 reps;30 seconds   Quad Stretch 3 reps;30 seconds   Knee: Self-Stretch to increase Flexion Right;5 reps;20 seconds   Knee: Self-Stretch Limitations on 18"    Knee/Hip Exercises: Aerobic   Stationary Bike x 8:00'   Knee/Hip Exercises: Standing   Terminal Knee Extension Strengthening;Right;10 reps   Functional Squat 10 reps   Functional Squat Limitations picking kleenx box up off a 6" box    Stairs x 3 RT   SLS with Vectors x 10" x 3    Knee/Hip Exercises: Seated   Sit to Sand 10 reps   Knee/Hip Exercises: Supine   Quad Sets 10 reps   Knee Extension --  2   Knee Flexion --  120                  PT Short Term Goals - 06/17/15 1144    PT SHORT TERM GOAL #1   Title Pt will be independent in HEP   Baseline 12/10/2014-4 degrees   Time 2  Period Weeks   Status On-going   PT SHORT TERM GOAL #2   Title Knee extension will improve to 0 degrees to improve knee extension during gait.    Baseline on 12/10/2014 122 degrees    Time 2   Period Weeks   Status On-going   PT SHORT TERM GOAL #3   Title Improve knee flexion to 115 to improve ability to ascend/descend stairs.    Time 2   Period Weeks   Status On-going   PT SHORT TERM GOAL #4   Title Improve quad, glut, and hamstring strength to 4/5 to improve gait mechancis and functional mobility.    Time 2   Period Weeks   Status On-going   PT SHORT TERM GOAL #5   Title Patient will be indepent with HEP.    Time 3   Period Weeks   Status On-going           PT Long Term Goals - 06/17/15 1144    PT LONG TERM GOAL #1   Title Improve R knee flexion to 120 degrees to allow pt to descned stairs with reciprocal pattern.    Time 4   Period Weeks   Status On-going   PT LONG TERM GOAL #2   Title Pt will complete five time sit to stand test in 11 seconds to demonstrate improved functional mobility and BLE strength.    Time 4   Period Weeks   Status On-going   PT LONG  TERM GOAL #3   Title Pt will maintain SLS on RLE x 15 seconds to demonstrate improved balance and decreased fall risk.    Time 4   Period Weeks   Status On-going               Plan - 06/22/15 1058    Clinical Impression Statement Pt ROM/strength and balance are all improving.  Pt main deficit at this time is balance and hip extension strength as well as acitivy tolerace.  Will focus on these aspects next treatement    PT Next Visit Plan begin warrior poses         Problem List Patient Active Problem List   Diagnosis Date Noted  . Osteoarthritis of right knee 05/15/2015  . S/P knee replacement 05/15/2015  . Lumbago 05/10/2011   Rayetta Humphrey, PT CLT [phone] 06/22/2015, 11:00 AM  [place] [address] [place], [name], [address] Phone: [phone]   Fax:  [phone]

## 2015-06-24 ENCOUNTER — Ambulatory Visit (HOSPITAL_COMMUNITY): Payer: Managed Care, Other (non HMO) | Admitting: Physical Therapy

## 2015-06-24 DIAGNOSIS — R29898 Other symptoms and signs involving the musculoskeletal system: Secondary | ICD-10-CM

## 2015-06-24 DIAGNOSIS — M25661 Stiffness of right knee, not elsewhere classified: Secondary | ICD-10-CM

## 2015-06-24 DIAGNOSIS — M25561 Pain in right knee: Secondary | ICD-10-CM

## 2015-06-24 DIAGNOSIS — Z96651 Presence of right artificial knee joint: Secondary | ICD-10-CM | POA: Diagnosis not present

## 2015-06-24 DIAGNOSIS — R262 Difficulty in walking, not elsewhere classified: Secondary | ICD-10-CM

## 2015-06-24 NOTE — Therapy (Signed)
[place], [name], [address] Phone: [phone]   Fax:  [phone]  Physical Therapy Treatment  Patient Details  Name: Marc Garcia MRN: [id number] Date of Birth: 10/29/1956 Referring Provider:  Sharilyn Sites, MD  Encounter Date: 06/24/2015      PT End of Session - 06/24/15 1007    Visit Number 5   Number of Visits 10   Authorization Type [employer]   PT Start Time 0930   PT Stop Time 1024   PT Time Calculation (min) 54 min   Activity Tolerance Patient tolerated treatment well      Past Medical History  Diagnosis Date  . Borderline diabetic   . Hypertension   . Hyperlipidemia   . Emphysema of lung   . GERD (gastroesophageal reflux disease)   . Arthritis     Past Surgical History  Procedure Laterality Date  . Knee surgery Right     times 2  . Cystectomy    . Hernia repair      inguinal bilat   . Colonscopy     . Total knee arthroplasty Right 05/15/2015    Procedure: RIGHT TOTAL KNEE ARTHROPLASTY;  Surgeon: Sydnee Cabal, MD;  Location: [facility];  Service: Orthopedics;  Laterality: Right;    There were no vitals filed for this visit.  Visit Diagnosis:  Status post total right knee replacement  Difficulty walking  Right knee pain  Knee stiffness, right  Right leg weakness      Subjective Assessment - 06/24/15 0939    Subjective Pt states his little toe is bothering him more than his knee, ( fx his toe).   Currently in Pain? Yes   Pain Score 2    Pain Location Knee   Pain Orientation Right   Multiple Pain Sites Yes   Pain Score 5   Pain Location Toe (Comment which one)   Pain Descriptors / Indicators Aching   Pain Type Acute pain                 [facility] Adult PT Treatment/Exercise - 06/24/15 0941    Knee/Hip Exercises: Stretches   Passive Hamstring Stretch 3 reps;30 seconds   Passive Hamstring Stretch Limitations on 18" stip    Quad Stretch 3 reps;30 seconds   Knee:  Self-Stretch to increase Flexion Right;5 reps;20 seconds   Knee: Self-Stretch Limitations on 18"    Knee/Hip Exercises: Aerobic   Stationary Bike x 8:00'   Knee/Hip Exercises: Standing   Terminal Knee Extension Strengthening;Right;10 reps   Functional Squat 10 reps   Functional Squat Limitations picking kleenx box up off a 6" box    Stairs x 4 RT   SLS with Vectors x 10" x 3    Knee/Hip Exercises: Seated   Sit to Sand 10 reps   Knee/Hip Exercises: Supine   Quad Sets 10 reps   Knee Extension --  2   Knee Flexion --  122   Knee/Hip Exercises: Prone   Contract/Relax to Increase Flexion x5   Other Prone Exercises PROM                  PT Short Term Goals - 06/17/15 1144    PT SHORT TERM GOAL #1   Title Pt will be independent in HEP   Baseline 12/10/2014-4 degrees   Time 2   Period Weeks   Status On-going   PT SHORT TERM GOAL #2   Title Knee extension will improve  to 0 degrees to improve knee extension during gait.    Baseline on 12/10/2014 122 degrees    Time 2   Period Weeks   Status On-going   PT SHORT TERM GOAL #3   Title Improve knee flexion to 115 to improve ability to ascend/descend stairs.    Time 2   Period Weeks   Status On-going   PT SHORT TERM GOAL #4   Title Improve quad, glut, and hamstring strength to 4/5 to improve gait mechancis and functional mobility.    Time 2   Period Weeks   Status On-going   PT SHORT TERM GOAL #5   Title Patient will be indepent with HEP.    Time 3   Period Weeks   Status On-going           PT Long Term Goals - 06/17/15 1144    PT LONG TERM GOAL #1   Title Improve R knee flexion to 120 degrees to allow pt to descned stairs with reciprocal pattern.    Time 4   Period Weeks   Status On-going   PT LONG TERM GOAL #2   Title Pt will complete five time sit to stand test in 11 seconds to demonstrate improved functional mobility and BLE strength.    Time 4   Period Weeks   Status On-going   PT LONG TERM GOAL #3    Title Pt will maintain SLS on RLE x 15 seconds to demonstrate improved balance and decreased fall risk.    Time 4   Period Weeks   Status On-going               Plan - 06/24/15 1007    Clinical Impression Statement PT continues to show imporved stabilitiy with functional activity.  Pt appears that he will be ready for discharge on visit 10.  Pt needed minimal verbal cuing to complete exercises with proper technique.  Warrior poses held due to toe pain   PT Next Visit Plan begin warrior poses         Problem List Patient Active Problem List   Diagnosis Date Noted  . Osteoarthritis of right knee 05/15/2015  . S/P knee replacement 05/15/2015  . Lumbago 05/10/2011    Rayetta Humphrey, PT CLT [phone] 06/24/2015, 10:17 AM  [place], [name], [address] Phone: [phone]   Fax:  [phone]

## 2015-06-26 ENCOUNTER — Ambulatory Visit (HOSPITAL_COMMUNITY): Payer: Managed Care, Other (non HMO)

## 2015-06-26 DIAGNOSIS — M25661 Stiffness of right knee, not elsewhere classified: Secondary | ICD-10-CM

## 2015-06-26 DIAGNOSIS — Z96651 Presence of right artificial knee joint: Secondary | ICD-10-CM

## 2015-06-26 DIAGNOSIS — R262 Difficulty in walking, not elsewhere classified: Secondary | ICD-10-CM

## 2015-06-26 DIAGNOSIS — Z9889 Other specified postprocedural states: Secondary | ICD-10-CM

## 2015-06-26 DIAGNOSIS — M25561 Pain in right knee: Secondary | ICD-10-CM

## 2015-06-26 DIAGNOSIS — R29898 Other symptoms and signs involving the musculoskeletal system: Secondary | ICD-10-CM

## 2015-06-26 NOTE — Therapy (Signed)
[place] [address] [place], [name], [address] Phone: [phone]   Fax:  [phone]  Physical Therapy Treatment  Patient Details  Name: Marc Garcia MRN: [id number] Date of Birth: [birth year] Referring Provider:  Sharilyn Sites, MD  Encounter Date: 06/26/2015      PT End of Session - 06/26/15 1108    Visit Number 6   Number of Visits 10   Date for PT Re-Evaluation 07/13/15   Authorization Type [employer]   PT Start Time 1102   PT Stop Time 1150   PT Time Calculation (min) 48 min   Activity Tolerance Patient tolerated treatment well   Behavior During Therapy [facility] for tasks assessed/performed      Past Medical History  Diagnosis Date  . Borderline diabetic   . Hypertension   . Hyperlipidemia   . Emphysema of lung   . GERD (gastroesophageal reflux disease)   . Arthritis     Past Surgical History  Procedure Laterality Date  . Knee surgery Right     times 2  . Cystectomy    . Hernia repair      inguinal bilat   . Colonscopy     . Total knee arthroplasty Right 05/15/2015    Procedure: RIGHT TOTAL KNEE ARTHROPLASTY;  Surgeon: Sydnee Cabal, MD;  Location: [facility];  Service: Orthopedics;  Laterality: Right;    There were no vitals filed for this visit.  Visit Diagnosis:  Status post total right knee replacement  Difficulty walking  Right knee pain  Knee stiffness, right  Right leg weakness  S/P right knee arthroscopy      Subjective Assessment - 06/26/15 1102    Subjective Pt stated knee is achey at night, pain minimal pain 2/10.  Main difficulty with stairs.  Little toe continues to be sore.   Currently in Pain? Yes   Pain Score 2    Pain Location Knee   Pain Orientation Right   Pain Descriptors / Indicators Tightness;Aching   Pain Score 3   Pain Location Toe (Comment which one)  little toe   Pain Orientation Left            [facility] PT Assessment - 06/26/15 0001    Assessment   Medical Diagnosis R  TKA   Onset Date/Surgical Date 05/15/15   Next MD Visit [place] 06/29/2015   Prior Therapy yes- [facility]   ROM / Strength   AROM / PROM / Strength AROM   Transfers   Five time sit to stand comments  10.69" was 13.15"             [facility] Adult PT Treatment/Exercise - 06/26/15 0001    Exercises   Exercises Knee/Hip   Knee/Hip Exercises: Stretches   Passive Hamstring Stretch 3 reps;30 seconds   Passive Hamstring Stretch Limitations on 18" stip    Knee: Self-Stretch to increase Flexion Right;5 reps;20 seconds   Knee: Self-Stretch Limitations on 18"    Gastroc Stretch Both;3 reps;30 seconds   Gastroc Stretch Limitations slant board    Knee/Hip Exercises: Aerobic   Stationary Bike 8' @ seat 10   Knee/Hip Exercises: Standing   Terminal Knee Extension Strengthening;Right;10 reps   Functional Squat 10 reps   Functional Squat Limitations picking kleenx box up off a 6" box    Stairs x 4 RT   SLS Rt 16", Lt 19"   SLS with Vectors x 10" x 3    Other Standing Knee Exercises Warrior pose I  2x 30", Warrior pose II 2x 20"Bil   Knee/Hip Exercises: Seated   Sit to Sand 10 reps;without UE support  5 STS in 10.69"   Knee/Hip Exercises: Supine   Quad Sets 10 reps   Knee Extension Limitations 2 degrees   Knee Flexion Limitations 120 degrees            PT Short Term Goals - 06/26/15 1109    PT SHORT TERM GOAL #1   Title Pt will be independent in HEP   Baseline 06/26/2015:  Reports compliance daily   Status On-going   PT SHORT TERM GOAL #2   Title Knee extension will improve to 0 degrees to improve knee extension during gait.    Baseline 06/26/2015 AROM 2-120 degrees   Status On-going   PT SHORT TERM GOAL #3   Title Improve knee flexion to 115 to improve ability to ascend/descend stairs.    Baseline 06/26/2015 AROM 2-120 degrees   Status Achieved   PT SHORT TERM GOAL #4   Title Improve quad, glut, and hamstring strength to 4/5 to improve gait mechancis and functional mobility.     Status On-going           PT Long Term Goals - 06/26/15 1109    PT LONG TERM GOAL #1   Title Improve R knee flexion to 120 degrees to allow pt to descned stairs with reciprocal pattern.    Baseline 06/26/2015 AROM 2-120 degrees; continues to require cueing for eccentric control descending stairs   PT LONG TERM GOAL #2   Title Pt will complete five time sit to stand test in 11 seconds to demonstrate improved functional mobility and BLE strength.    Baseline 06/26/2015 5 STS 10.69 seconds   Status Achieved   PT LONG TERM GOAL #3   Title Pt will maintain SLS on RLE x 15 seconds to demonstrate improved balance and decreased fall risk.    Status Achieved               Plan - 06/26/15 1151    Clinical Impression Statement Reviewed goals prior MD apt on Monday.  Pt with 5/5 strength except for gluteal musculature.  AROM 2-120 degrees.  Added warrior poses to improve balance stability with CGA to reduce risk of falls.  Pt continues to demonstrate weak eccentric control with descending stairs and requires minimal cueing for proper form and technique with proper lifting.     PT Next Visit Plan F/U with MD on Monday.  Continue with current PT POC for gluteal strengthening, quad extension and warrior poses.        Problem List Patient Active Problem List   Diagnosis Date Noted  . Osteoarthritis of right knee 05/15/2015  . S/P knee replacement 05/15/2015  . Lumbago 05/10/2011   [name], LPTA; [place]  [name] 06/26/2015, 1:13 PM  [place] [address] [place], [name], [address] Phone: [phone]   Fax:  [phone]

## 2015-06-29 ENCOUNTER — Ambulatory Visit (HOSPITAL_COMMUNITY): Payer: Managed Care, Other (non HMO)

## 2015-06-29 DIAGNOSIS — M25661 Stiffness of right knee, not elsewhere classified: Secondary | ICD-10-CM

## 2015-06-29 DIAGNOSIS — Z9889 Other specified postprocedural states: Secondary | ICD-10-CM

## 2015-06-29 DIAGNOSIS — R29898 Other symptoms and signs involving the musculoskeletal system: Secondary | ICD-10-CM

## 2015-06-29 DIAGNOSIS — Z96651 Presence of right artificial knee joint: Secondary | ICD-10-CM

## 2015-06-29 DIAGNOSIS — R262 Difficulty in walking, not elsewhere classified: Secondary | ICD-10-CM

## 2015-06-29 DIAGNOSIS — M25561 Pain in right knee: Secondary | ICD-10-CM

## 2015-06-29 NOTE — Therapy (Signed)
[place], [name], [address] Phone: [phone]   Fax:  [phone]  Physical Therapy Treatment  Patient Details  Name: Marc Garcia MRN: [id number] Date of Birth: 1956-05-13 Referring Provider:  Sydnee Cabal, MD  Encounter Date: 06/29/2015      PT End of Session - 06/29/15 1117    Visit Number 7   Number of Visits 10   Date for PT Re-Evaluation 07/13/15   Authorization Type [employer]   PT Start Time 1112   PT Stop Time 1200   PT Time Calculation (min) 48 min   Activity Tolerance Patient tolerated treatment well   Behavior During Therapy [name] for tasks assessed/performed      Past Medical History  Diagnosis Date  . Borderline diabetic   . Hypertension   . Hyperlipidemia   . Emphysema of lung   . GERD (gastroesophageal reflux disease)   . Arthritis     Past Surgical History  Procedure Laterality Date  . Knee surgery Right     times 2  . Cystectomy    . Hernia repair      inguinal bilat   . Colonscopy     . Total knee arthroplasty Right 05/15/2015    Procedure: RIGHT TOTAL KNEE ARTHROPLASTY;  Surgeon: Marc Cabal, MD;  Location: [facility];  Service: Orthopedics;  Laterality: Right;    There were no vitals filed for this visit.  Visit Diagnosis:  Status post total right knee replacement  Difficulty walking  Right knee pain  Knee stiffness, right  Right leg weakness  S/P right knee arthroscopy      Subjective Assessment - 06/29/15 1113    Subjective Pt stated knee pain continues to be achey at night, reports he feels it is improving slowly though hard to tell with medication.   Currently in Pain? Yes   Pain Score 2    Pain Location Knee   Pain Orientation Right   Pain Descriptors / Indicators Aching;Tightness             [facility] Adult PT Treatment/Exercise - 06/29/15 0001    Exercises   Exercises Knee/Hip   Knee/Hip Exercises: Stretches   Passive Hamstring Stretch 3 reps;30  seconds   Passive Hamstring Stretch Limitations on 18" stip    Quad Stretch 3 reps;30 seconds   Quad Stretch Limitations prone with rope   Knee: Self-Stretch to increase Flexion Right;5 reps;20 seconds   Knee: Self-Stretch Limitations on 18"    Gastroc Stretch Both;3 reps;30 seconds   Gastroc Stretch Limitations slant board    Knee/Hip Exercises: Aerobic   Stationary Bike 8' @ seat 10   Knee/Hip Exercises: Standing   Heel Raises Limitations heel walking for TKE 2RT   Terminal Knee Extension Right;15 reps;Theraband   Theraband Level (Terminal Knee Extension) Level 3 (Green)   Functional Squat 15 reps   Functional Squat Limitations picking kleenx box up off a 6" box    Stairs 5RT   SLS with Vectors x 10" x 3    Other Standing Knee Exercises Warrior pose I 2x 30", Warrior pose II 2x 30"Bil   Other Standing Knee Exercises Sidestepping 2RT with GRB; [place] 2RT            PT Short Term Goals - 06/26/15 1109    PT SHORT TERM GOAL #1   Title Pt will be independent in HEP   Baseline 06/26/2015:  Reports compliance daily   Status On-going   PT  SHORT TERM GOAL #2   Title Knee extension will improve to 0 degrees to improve knee extension during gait.    Baseline 06/26/2015 AROM 2-120 degrees   Status On-going   PT SHORT TERM GOAL #3   Title Improve knee flexion to 115 to improve ability to ascend/descend stairs.    Baseline 06/26/2015 AROM 2-120 degrees   Status Achieved   PT SHORT TERM GOAL #4   Title Improve quad, glut, and hamstring strength to 4/5 to improve gait mechancis and functional mobility.    Status On-going           PT Long Term Goals - 06/26/15 1109    PT LONG TERM GOAL #1   Title Improve R knee flexion to 120 degrees to allow pt to descned stairs with reciprocal pattern.    Baseline 06/26/2015 AROM 2-120 degrees; continues to require cueing for eccentric control descending stairs   PT LONG TERM GOAL #2   Title Pt will complete five time sit to stand  test in 11 seconds to demonstrate improved functional mobility and BLE strength.    Baseline 06/26/2015 5 STS 10.69 seconds   Status Achieved   PT LONG TERM GOAL #3   Title Pt will maintain SLS on RLE x 15 seconds to demonstrate improved balance and decreased fall risk.    Status Achieved               Plan - 06/29/15 1142    Clinical Impression Statement Session focus on improving functional strengthening primarily quad and gluteal musculature for increased ease descending stairs, control with sit to stands and improving knee extension.  Pt improving form with proper lifting with less cueing required.  Improved stabilty wtih ability to increase hold time with warrior pose II for 30" this session. No reports of increased pain through session   PT Next Visit Plan F/U with MD on Monday.  Continue with current PT POC for gluteal strengthening, quad extension and warrior poses.        Problem List Patient Active Problem List   Diagnosis Date Noted  . Osteoarthritis of right knee 05/15/2015  . S/P knee replacement 05/15/2015  . Lumbago 05/10/2011   [name], [place]; [place]   [name] 06/29/2015, 12:05 PM  [place] [address] [place], [name], [address] Phone: [phone]   Fax:  [phone]

## 2015-07-01 ENCOUNTER — Ambulatory Visit (HOSPITAL_COMMUNITY): Payer: Managed Care, Other (non HMO)

## 2015-07-01 DIAGNOSIS — Z9889 Other specified postprocedural states: Secondary | ICD-10-CM

## 2015-07-01 DIAGNOSIS — Z96651 Presence of right artificial knee joint: Secondary | ICD-10-CM | POA: Diagnosis not present

## 2015-07-01 DIAGNOSIS — M25661 Stiffness of right knee, not elsewhere classified: Secondary | ICD-10-CM

## 2015-07-01 DIAGNOSIS — R262 Difficulty in walking, not elsewhere classified: Secondary | ICD-10-CM

## 2015-07-01 DIAGNOSIS — R29898 Other symptoms and signs involving the musculoskeletal system: Secondary | ICD-10-CM

## 2015-07-01 DIAGNOSIS — M25561 Pain in right knee: Secondary | ICD-10-CM

## 2015-07-01 NOTE — Patient Instructions (Addendum)
Knee Extension Mobilization: Hang (Prone)   With table supporting thighs with knee and feet hanging off bed.  Hold 3-5 minutes and increase hang time as tolerated. Repeat 1-2  times per set. Do 1-2_ sessions per day.  http://orth.exer.us/722   Copyright  VHI. All rights reserved.  Bridging  Slowly raise buttocks from floor, keeping stomach tight. Repeat 10-20 times per set. Do 1-2 sets per session.  http://orth.exer.us/1096   Copyright  VHI. All rights reserved.    Straight Leg Raise (Prone)   Abdomen and head supported, keep left knee locked and raise leg at hip. Avoid arching low back. Repeat 10-20 times per set. Do 1-2 sets per session.   http://orth.exer.us/1112   Copyright  VHI. All rights reserved.

## 2015-07-01 NOTE — Therapy (Signed)
[place], [name], [address] Phone: [phone]   Fax:  [phone]  Physical Therapy Treatment  Patient Details  Name: Marc Garcia MRN: [id number] Date of Birth: 1956/02/24 Referring Provider:  Sydnee Cabal, MD  Encounter Date: 07/01/2015      PT End of Session - 07/01/15 1141    Visit Number 8   Number of Visits 10   Date for PT Re-Evaluation 07/13/15   Authorization Type [employer]   PT Start Time 1101   PT Stop Time 1149   PT Time Calculation (min) 48 min   Activity Tolerance Patient tolerated treatment well   Behavior During Therapy [facility] for tasks assessed/performed      Past Medical History  Diagnosis Date  . Borderline diabetic   . Hypertension   . Hyperlipidemia   . Emphysema of lung   . GERD (gastroesophageal reflux disease)   . Arthritis     Past Surgical History  Procedure Laterality Date  . Knee surgery Right     times 2  . Cystectomy    . Hernia repair      inguinal bilat   . Colonscopy     . Total knee arthroplasty Right 05/15/2015    Procedure: RIGHT TOTAL KNEE ARTHROPLASTY;  Surgeon: Sydnee Cabal, MD;  [facility];  Service: Orthopedics;  Laterality: Right;    There were no vitals filed for this visit.  Visit Diagnosis:  Status post total right knee replacement  Difficulty walking  Right knee pain  Knee stiffness, right  Right leg weakness  S/P right knee arthroscopy      Subjective Assessment - 07/01/15 1105    Subjective Pt stated knee has minimal pain today at 2/10, continues to be achey at night and ascending stairs.  Reported MD wishes to continue OPPT to improve knee extension.   Currently in Pain? Yes   Pain Score 2    Pain Location Knee   Pain Orientation Right           [facility] Adult PT Treatment/Exercise - 07/01/15 0001    Exercises   Exercises Knee/Hip   Knee/Hip Exercises: Stretches   Passive Hamstring Stretch 3 reps;30 seconds   Passive  Hamstring Stretch Limitations on 18" stip    Quad Stretch 3 reps;30 seconds   Quad Stretch Limitations prone with rope   Knee: Self-Stretch to increase Flexion 5 reps;10 seconds;Right   Gastroc Stretch Both;3 reps;30 seconds   Gastroc Stretch Limitations slant board    Knee/Hip Exercises: Aerobic   Stationary Bike 8' @ seat 10   Knee/Hip Exercises: Standing   Heel Raises Limitations heel walking for TKE 2RT   Terminal Knee Extension Right;15 reps;Theraband   Theraband Level (Terminal Knee Extension) Level 3 (Green)   Functional Squat 15 reps   Functional Squat Limitations picking kleenx box up off a 6" box    Stairs 5RT   Knee/Hip Exercises: Supine   Quad Sets 10 reps   Short Arc Quad Sets 15 reps   Terminal Knee Extension 10 reps   Terminal Knee Extension Limitations manual cueing to reduce SLR   Bridges 15 reps   Knee/Hip Exercises: Prone   Hip Extension 15 reps   Prone Knee Hang 5 minutes   Prone Knee Hang Limitations manual STM to hamstrings medial> lateral   Other Prone Exercises terminal knee extension 15x 5"            PT Short Term Goals - 06/26/15  [place] #1   Title Pt will be independent in HEP   Baseline 06/26/2015:  Reports compliance daily   Status On-going   PT SHORT TERM GOAL #2   Title Knee extension will improve to 0 degrees to improve knee extension during gait.    Baseline 06/26/2015 AROM 2-120 degrees   Status On-going   PT SHORT TERM GOAL #3   Title Improve knee flexion to 115 to improve ability to ascend/descend stairs.    Baseline 06/26/2015 AROM 2-120 degrees   Status Achieved   PT SHORT TERM GOAL #4   Title Improve quad, glut, and hamstring strength to 4/5 to improve gait mechancis and functional mobility.    Status On-going           PT Long Term Goals - 06/26/15 1109    PT LONG TERM GOAL #1   Title Improve R knee flexion to 120 degrees to allow pt to descned stairs with reciprocal pattern.    Baseline 06/26/2015  AROM 2-120 degrees; continues to require cueing for eccentric control descending stairs   PT LONG TERM GOAL #2   Title Pt will complete five time sit to stand test in 11 seconds to demonstrate improved functional mobility and BLE strength.    Baseline 06/26/2015 5 STS 10.69 seconds   Status Achieved   PT LONG TERM GOAL #3   Title Pt will maintain SLS on RLE x 15 seconds to demonstrate improved balance and decreased fall risk.    Status Achieved               Plan - 07/01/15 1244    Clinical Impression Statement Session focus on improving knee extension and strengthening for gltueal and quadricep musculature.  Added prone knee hang with manual to hamstrings wtih noted tightness medial aspect to improve knee extension.  Noted weak gluteal musculature with gait and prone SLR.  Pt given HEP including prone knee hang and gluteal strengthening exercises.   PT Next Visit Plan  Continue with current PT POC for knee extension, gluteal strengthening, quad extension and warrior poses.        Problem List Patient Active Problem List   Diagnosis Date Noted  . Osteoarthritis of right knee 05/15/2015  . S/P knee replacement 05/15/2015  . Lumbago 05/10/2011   [name], [place]; [place]  [name] 07/01/2015, 12:49 PM  [place] [address] [place], [name], [address] Phone: [phone]   Fax:  [phone]

## 2015-07-03 ENCOUNTER — Ambulatory Visit (HOSPITAL_COMMUNITY): Payer: Managed Care, Other (non HMO) | Attending: Specialist | Admitting: Physical Therapy

## 2015-07-03 DIAGNOSIS — M25561 Pain in right knee: Secondary | ICD-10-CM | POA: Diagnosis present

## 2015-07-03 DIAGNOSIS — Z96651 Presence of right artificial knee joint: Secondary | ICD-10-CM | POA: Diagnosis present

## 2015-07-03 DIAGNOSIS — Z9889 Other specified postprocedural states: Secondary | ICD-10-CM | POA: Diagnosis present

## 2015-07-03 DIAGNOSIS — M25661 Stiffness of right knee, not elsewhere classified: Secondary | ICD-10-CM | POA: Diagnosis present

## 2015-07-03 DIAGNOSIS — R262 Difficulty in walking, not elsewhere classified: Secondary | ICD-10-CM | POA: Diagnosis present

## 2015-07-03 DIAGNOSIS — R29898 Other symptoms and signs involving the musculoskeletal system: Secondary | ICD-10-CM

## 2015-07-03 NOTE — Therapy (Signed)
[place], [name], [address] Phone: [phone]   Fax:  [phone]  Physical Therapy Treatment  Patient Details  Name: Marc Garcia MRN: [id number] Date of Birth: [birth year] Referring Provider:  Sydnee Cabal, MD  Encounter Date: 07/03/2015      PT End of Session - 07/03/15 1011    Visit Number 9   Number of Visits 10   Authorization Type [employer]ign[employer]   PT Start Time [phone]   PT Stop Time 1025   PT Time Calculation (min) 54 min   Activity Tolerance Patient tolerated treatment well      Past Medical History  Diagnosis Date  . Borderline diabetic   . Hypertension   . Hyperlipidemia   . Emphysema of lung   . GERD (gastroesophageal reflux disease)   . Arthritis     Past Surgical History  Procedure Laterality Date  . Knee surgery Right     times 2  . Cystectomy    . Hernia repair      inguinal bilat   . Colonscopy     . Total knee arthroplasty Right 05/15/2015    Procedure: RIGHT TOTAL KNEE ARTHROPLASTY;  Surgeon: Sydnee Cabal, MD;  [facility];  Service: Orthopedics;  Laterality: Right;    There were no vitals filed for this visit.  Visit Diagnosis:  Status post total right knee replacement  Difficulty walking  Right knee pain  Knee stiffness, right  Right leg weakness      Subjective Assessment - 07/03/15 0934    Subjective Pt states that he is still having difficulty with sit to stand activity.   Currently in Pain? Yes   Pain Score 2             [facility] PT Assessment - 07/03/15 0001    AROM   Right Knee Extension 4   Right Knee Flexion 122   Strength   Right Hip Flexion 5/5  was 4/5   Right Hip Extension 3+/5  was 3-/5   Right Hip ABduction 5/5  was 4/5   Left Hip Flexion 5/5   Right Knee Flexion 5/5  was 4-/5   Right Knee Extension 5/5  was 4-/5                     [facility] Adult PT Treatment/Exercise - 07/03/15 0001    Exercises   Exercises Knee/Hip    Knee/Hip Exercises: Stretches   Passive Hamstring Stretch Right;3 reps;30 seconds   Passive Hamstring Stretch Limitations on 18"   Quad Stretch --   Sports administrator Limitations --   Knee: Self-Stretch to increase Flexion Right;3 reps;30 seconds   Knee: Self-Stretch Limitations on 18"   Gastroc Stretch Both;3 reps;30 seconds   Gastroc Stretch Limitations slant board    Knee/Hip Exercises: Aerobic   Stationary Bike 8' @ seat 10   Knee/Hip Exercises: Standing   Heel Raises Right;10 reps   Heel Raises Limitations --   Knee Flexion Right;15 reps   Knee Flexion Limitations 5#   Terminal Knee Extension Right;15 reps;Theraband   Theraband Level (Terminal Knee Extension) --   Functional Squat 15 reps   Functional Squat Limitations picking kleenx box up off a 4" box    Stairs --   Knee/Hip Exercises: Seated   Sit to [employer] 10 reps   Knee/Hip Exercises: Supine   Quad Sets 10 reps   Short Arc Quad Sets --   Terminal Knee Extension 10  reps   Terminal Knee Extension Limitations manual cueing to reduce SLR   Bridges --   Knee Extension Limitations 4   Knee Flexion Limitations 122   Knee/Hip Exercises: Prone   Hip Extension 15 reps   Prone Knee Hang 5 minutes   Prone Knee Hang Weights (lbs) 3#   Prone Knee Hang Limitations manual STM to hamstrings medial> lateral   Other Prone Exercises terminal knee extension 15x 5"                  PT Short Term Goals - 06/26/15 1109    PT SHORT TERM GOAL #1   Title Pt will be independent in HEP   Baseline 06/26/2015:  Reports compliance daily   Status On-going   PT SHORT TERM GOAL #2   Title Knee extension will improve to 0 degrees to improve knee extension during gait.    Baseline 06/26/2015 AROM 2-120 degrees   Status On-going   PT SHORT TERM GOAL #3   Title Improve knee flexion to 115 to improve ability to ascend/descend stairs.    Baseline 06/26/2015 AROM 2-120 degrees   Status Achieved   PT SHORT TERM GOAL #4   Title Improve  quad, glut, and hamstring strength to 4/5 to improve gait mechancis and functional mobility.    Status On-going           PT Long Term Goals - 06/26/15 1109    PT LONG TERM GOAL #1   Title Improve R knee flexion to 120 degrees to allow pt to descned stairs with reciprocal pattern.    Baseline 06/26/2015 AROM 2-120 degrees; continues to require cueing for eccentric control descending stairs   PT LONG TERM GOAL #2   Title Pt will complete five time sit to stand test in 11 seconds to demonstrate improved functional mobility and BLE strength.    Baseline 06/26/2015 5 STS 10.69 seconds   Status Achieved   PT LONG TERM GOAL #3   Title Pt will maintain SLS on RLE x 15 seconds to demonstrate improved balance and decreased fall risk.    Status Achieved               Plan - 07/03/15 1012    Clinical Impression Statement Pt main deficit is knee extension and gluteus maximus strength.  Pt to focus on theses aspects pt went to MD who want pt to continue    PT Next Visit Plan work on heel toe gait pattern; sit to stand, step ups forward, prone hip extension with 3#; and terminal extension.        Problem List Patient Active Problem List   Diagnosis Date Noted  . Osteoarthritis of right knee 05/15/2015  . S/P knee replacement 05/15/2015  . Lumbago 05/10/2011  Rayetta Humphrey, PT CLT [phone] 07/03/2015, 10:17 AM  [place], [name], [address] Phone: [phone]   Fax:  [phone]

## 2015-07-09 ENCOUNTER — Ambulatory Visit (HOSPITAL_COMMUNITY): Payer: Managed Care, Other (non HMO)

## 2015-07-09 DIAGNOSIS — Z9889 Other specified postprocedural states: Secondary | ICD-10-CM

## 2015-07-09 DIAGNOSIS — Z96651 Presence of right artificial knee joint: Secondary | ICD-10-CM

## 2015-07-09 DIAGNOSIS — R29898 Other symptoms and signs involving the musculoskeletal system: Secondary | ICD-10-CM

## 2015-07-09 DIAGNOSIS — M25561 Pain in right knee: Secondary | ICD-10-CM

## 2015-07-09 DIAGNOSIS — R262 Difficulty in walking, not elsewhere classified: Secondary | ICD-10-CM

## 2015-07-09 DIAGNOSIS — M25661 Stiffness of right knee, not elsewhere classified: Secondary | ICD-10-CM

## 2015-07-09 NOTE — Therapy (Signed)
[place], [name], [address] Phone: [phone]   Fax:  [phone]  Physical Therapy Treatment  Patient Details  Name: Marc Garcia MRN: [id number] Date of Birth: August 16, 1956 Referring Provider:  Sharilyn Sites, MD  Encounter Date: 07/09/2015      PT End of Session - 07/09/15 1617    Visit Number 10   Number of Visits 15   Date for PT Re-Evaluation 07/13/15   Authorization Type [employer]   Authorization Time Period 06/15/15-08/10/15   Authorization - Visit Number 10   PT Start Time 1522   PT Stop Time 1602   PT Time Calculation (min) 40 min   Activity Tolerance Patient tolerated treatment well   Behavior During Therapy [facility] for tasks assessed/performed      Past Medical History  Diagnosis Date  . Borderline diabetic   . Hypertension   . Hyperlipidemia   . Emphysema of lung   . GERD (gastroesophageal reflux disease)   . Arthritis     Past Surgical History  Procedure Laterality Date  . Knee surgery Right     times 2  . Cystectomy    . Hernia repair      inguinal bilat   . Colonscopy     . Total knee arthroplasty Right 05/15/2015    Procedure: RIGHT TOTAL KNEE ARTHROPLASTY;  Surgeon: Sydnee Cabal, MD;  [facility];  Service: Orthopedics;  Laterality: Right;    There were no vitals filed for this visit.  Visit Diagnosis:  Status post total right knee replacement  Difficulty walking  Right knee pain  Knee stiffness, right  Right leg weakness  S/P right knee arthroscopy      Subjective Assessment - 07/09/15 1529    Subjective Pt reports that he is overall quite satisfied with tretment and outcomes thus far. Pt reports he has been only fair with HEP compliance du eto feeling 'laszy' but agrees that he should become mor einvovled. Pt reports he has been working on stairs and straightening.    How long can you sit comfortably? no limitations   How long can you stand comfortably? 1 hour    How long can you walk comfortably? 30 minutes    Patient Stated Goals return to PLOF, be able to walk/climb stairs without knee pain   Pain Score 2    Pain Location Knee   Pain Orientation Right   Pain Descriptors / Indicators Aching;Tightness                         [facility] Adult PT Treatment/Exercise - 07/09/15 0001    Transfers   Five time sit to stand comments  10.85"   Knee/Hip Exercises: Stretches   Knee: Self-Stretch to increase Flexion 5 reps;30 seconds;Right  seated lunge stretch   Gastroc Stretch Right;3 reps;30 seconds  standing on step   Gastroc Stretch Limitations 6" step    Manual Therapy   Joint Mobilization Patella mobs: 2x60 med/lat; caudal-cranial; ant tib glide 3x45s  Tib-Fem distraction 3x30 seconds; tib-fem rotational 3x30sec                  PT Short Term Goals - 07/09/15 1534    PT SHORT TERM GOAL #1   Title Pt will be independent in HEP   Baseline 06/26/2015:  Reports compliance daily   Time 2   Period Weeks   Status Partially Met   PT SHORT TERM GOAL #2  Title Knee extension will improve to 0 degrees to improve knee extension during gait.    Baseline 09/08//2016 PROM 12-120 degrees   Time 2   Period Weeks   Status On-going   PT SHORT TERM GOAL #3   Title Improve knee flexion to 115 to improve ability to ascend/descend stairs.    Baseline 09/08//2016 PROM 12-120 degrees   Time 2   Period Weeks   Status Achieved   PT SHORT TERM GOAL #4   Title Improve quad, glut, and hamstring strength to 4/5 to improve gait mechancis and functional mobility.    Baseline 9/8 strength is 5/5 throughout.    Time 2   Period Weeks   Status Achieved   PT SHORT TERM GOAL #5   Title Patient will be indepent with HEP.    Time 3   Period Weeks   Status Achieved           PT Long Term Goals - 07/09/15 1633    PT LONG TERM GOAL #1   Title Improve R knee flexion to 120 degrees to allow pt to descned stairs with reciprocal pattern.     Baseline 9/8 knee flexion 120 degrees   Time 4   Period Weeks   Status Achieved   PT LONG TERM GOAL #2   Title Pt will complete five time sit to stand test in 11 seconds to demonstrate improved functional mobility and BLE strength.    Baseline 9/8 performed in 10.8 seconds    Time 4   Period Weeks   Status Achieved   PT LONG TERM GOAL #3   Title Pt will maintain SLS on RLE x 15 seconds to demonstrate improved balance and decreased fall risk.    Time 4   Period Weeks   Status Achieved               Plan - 07/09/15 1626    Clinical Impression Statement PT reviewed all of pt goals today and reviewed where pt was thought to be in terms of rehab. Pt continues to demonstrate limited ROM in both knee extension and flexion. Functional activity tolerance continues to be limited, with increases in swelling and pain after therapy session, but pt continues to improve in functional strength and mobility. Pt HEP has been updatd to better meet deficits at hand, and with continued emphasis on daily performance to achieve goals.    Pt will benefit from skilled therapeutic intervention in order to improve on the following deficits Abnormal gait;Decreased activity tolerance;Decreased balance;Decreased endurance;Decreased range of motion;Decreased strength;Difficulty walking   Rehab Potential Good   PT Frequency 2x / week   PT Duration 8 weeks   PT Treatment/Interventions ADLs/Self Care Home Management;Cryotherapy;Gait training;Stair training;Functional mobility training;Therapeutic activities;Therapeutic exercise;Balance training;Neuromuscular re-education;Manual techniques;Scar mobilization   PT Next Visit Plan review new stretches, and continue with patellar and tibiofemoral mobs.    PT Home Exercise Plan Seated knee flexion stretch, hanging off step calf stretch.    Consulted and Agree with Plan of Care Patient        Problem List Patient Active Problem List   Diagnosis Date Noted  .  Osteoarthritis of right knee 05/15/2015  . S/P knee replacement 05/15/2015  . Lumbago 05/10/2011    Buccola,Allan C 07/09/2015, 4:38 PM  4:38 PM  Rosamaria Lints, PT, DPT [place] License # [id number]       [facility] [address] [place], [place], [address] Phone: [phone]  Fax:  [phone]

## 2015-07-09 NOTE — Patient Instructions (Signed)
   Seated knee bending stretch.  Sitting with knee bent, foot flat on floor, slide hips forward until a stretch is felt in knee. Hold for 30 seconds, and then relax. Repeat 5 times. Perform 2 times each day.     Calf Stretch Stand with ball of R foot on edge of staep, drop the heel and keep the knee straight until a stretch is felt in the back of the calf and knee. Repeat 5x30 seconds, twice daily.

## 2015-07-10 ENCOUNTER — Ambulatory Visit (HOSPITAL_COMMUNITY): Payer: Managed Care, Other (non HMO)

## 2015-07-10 DIAGNOSIS — Z96651 Presence of right artificial knee joint: Secondary | ICD-10-CM

## 2015-07-10 DIAGNOSIS — M25561 Pain in right knee: Secondary | ICD-10-CM

## 2015-07-10 DIAGNOSIS — R29898 Other symptoms and signs involving the musculoskeletal system: Secondary | ICD-10-CM

## 2015-07-10 DIAGNOSIS — R262 Difficulty in walking, not elsewhere classified: Secondary | ICD-10-CM

## 2015-07-10 DIAGNOSIS — Z9889 Other specified postprocedural states: Secondary | ICD-10-CM

## 2015-07-10 DIAGNOSIS — M25661 Stiffness of right knee, not elsewhere classified: Secondary | ICD-10-CM

## 2015-07-10 NOTE — Therapy (Signed)
[facility] [address] [place], [place], [address] Phone: [phone]   Fax:  [phone]  Physical Therapy Treatment  Patient Details  Name: Marc Garcia MRN: [id number] Date of Birth: [birth year] Referring Provider:  Assunta Found, MD  Encounter Date: 07/10/2015      PT End of Session - 07/10/15 1452    Visit Number 11   Number of Visits 15   Date for PT Re-Evaluation 07/13/15   Authorization Type [employer]   Authorization Time Period 06/15/15-08/10/15   Authorization - Visit Number 11   PT Start Time 1435   PT Stop Time 1523   PT Time Calculation (min) 48 min   Activity Tolerance Patient tolerated treatment well   Behavior During Therapy [name] for tasks assessed/performed      Past Medical History  Diagnosis Date  . Borderline diabetic   . Hypertension   . Hyperlipidemia   . Emphysema of lung   . GERD (gastroesophageal reflux disease)   . Arthritis     Past Surgical History  Procedure Laterality Date  . Knee surgery Right     times 2  . Cystectomy    . Hernia repair      inguinal bilat   . Colonscopy     . Total knee arthroplasty Right 05/15/2015    Procedure: RIGHT TOTAL KNEE ARTHROPLASTY;  Surgeon: Eugenia Mcalpine, MD;  Location: [facility];  Service: Orthopedics;  Laterality: Right;    There were no vitals filed for this visit.  Visit Diagnosis:  Status post total right knee replacement  Difficulty walking  Knee stiffness, right  Right leg weakness  S/P right knee arthroscopy  Right knee pain      Subjective Assessment - 07/10/15 1439    Subjective Pt reports he has been completeing the joint mobs at home and stated it feels better at night.  Pain scale 2/10 Rt knee   Currently in Pain? Yes   Pain Score 2    Pain Location Knee   Pain Orientation Right   Pain Descriptors / Indicators Tightness            [facility] Adult PT Treatment/Exercise - 07/10/15 0001    Knee/Hip Exercises: Stretches   Active  Hamstring Stretch Right;3 reps;30 seconds   Active Hamstring Stretch Limitations 14in step   Quad Stretch 3 reps;30 seconds   Quad Stretch Limitations prone with rope   Gastroc Stretch Both;3 reps;30 seconds   Gastroc Stretch Limitations slant board   Knee/Hip Exercises: Aerobic   Stationary Bike 8' @ seat 9   Knee/Hip Exercises: Standing   Heel Raises Limitations heel walking for TKE 2RT   Terminal Knee Extension Right;15 reps;Theraband   Theraband Level (Terminal Knee Extension) Level 3 (Green)   Lateral Step Up Right;15 reps;Hand Hold: 1;Limitations;Step Height: 6"   Lateral Step Up Limitations 6in step   Forward Step Up Right;10 reps;Hand Hold: 1;Limitations   Forward Step Up Limitations 7in    Functional Squat 15 reps   Functional Squat Limitations picking kleenx box up off a 4" box    Stairs 5RT   Knee/Hip Exercises: Seated   Sit to Sand 10 reps   Knee/Hip Exercises: Prone   Hip Extension 15 reps   Hip Extension Limitations 3#   Prone Knee Hang 5 minutes   Prone Knee Hang Weights (lbs) 3#   Prone Knee Hang Limitations manual STM to hamstrings medial> lateral   Other Prone Exercises terminal knee extension 15x 5"  Manual Therapy   Joint Mobilization Patella mobs: 2x60 med/lat; caudal-cranial; ant tib glide 3x45s; Tib/fib mobs            PT Short Term Goals - 07/09/15 1534    PT SHORT TERM GOAL #1   Title Pt will be independent in HEP   Baseline 06/26/2015:  Reports compliance daily   Time 2   Period Weeks   Status Partially Met   PT SHORT TERM GOAL #2   Title Knee extension will improve to 0 degrees to improve knee extension during gait.    Baseline 09/08//2016 PROM 12-120 degrees   Time 2   Period Weeks   Status On-going   PT SHORT TERM GOAL #3   Title Improve knee flexion to 115 to improve ability to ascend/descend stairs.    Baseline 09/08//2016 PROM 12-120 degrees   Time 2   Period Weeks   Status Achieved   PT SHORT TERM GOAL #4   Title Improve quad,  glut, and hamstring strength to 4/5 to improve gait mechancis and functional mobility.    Baseline 9/8 strength is 5/5 throughout.    Time 2   Period Weeks   Status Achieved   PT SHORT TERM GOAL #5   Title Patient will be indepent with HEP.    Time 3   Period Weeks   Status Achieved           PT Long Term Goals - 07/09/15 1633    PT LONG TERM GOAL #1   Title Improve R knee flexion to 120 degrees to allow pt to descned stairs with reciprocal pattern.    Baseline 9/8 knee flexion 120 degrees   Time 4   Period Weeks   Status Achieved   PT LONG TERM GOAL #2   Title Pt will complete five time sit to stand test in 11 seconds to demonstrate improved functional mobility and BLE strength.    Baseline 9/8 performed in 10.8 seconds    Time 4   Period Weeks   Status Achieved   PT LONG TERM GOAL #3   Title Pt will maintain SLS on RLE x 15 seconds to demonstrate improved balance and decreased fall risk.    Time 4   Period Weeks   Status Achieved               Plan - 07/10/15 1516    Clinical Impression Statement Reviewed compliance with new stretches and joint mobs, session focus on functional strengthening and ROM.  Continued with manual technqiues to hamstrings during prone knee hang and patella/ tib/fib joint mobs to improve mobility.  Gait training complete to improve heel striike for TKE and improve gait mechanics.  Able to increase step height with forward and lateral step ups for functional strengthening, pt reports increase ease with sit to stands though does continue to c/o pian with STS.  Pt does continue to demonstrate impaired ROM both extension and flexion and weakness with gluteal musculature with gait and proper lifting.     PT Next Visit Plan Continue with current PT POC to improve gait mechanics, ROM and functional strengthening.  Continue prone SLR with 3# for gluteal strengthening.          Problem List Patient Active Problem List   Diagnosis Date Noted  .  Osteoarthritis of right knee 05/15/2015  . S/P knee replacement 05/15/2015  . Lumbago 05/10/2011   [name], LPTA; [name]  [name] 07/10/2015, 3:29 PM  [name] [name], [name], [address] Phone: [phone]   Fax:  [phone]

## 2015-07-13 ENCOUNTER — Ambulatory Visit (HOSPITAL_COMMUNITY): Payer: Managed Care, Other (non HMO)

## 2015-07-13 DIAGNOSIS — Z96651 Presence of right artificial knee joint: Secondary | ICD-10-CM | POA: Diagnosis not present

## 2015-07-13 DIAGNOSIS — R262 Difficulty in walking, not elsewhere classified: Secondary | ICD-10-CM

## 2015-07-13 DIAGNOSIS — M25561 Pain in right knee: Secondary | ICD-10-CM

## 2015-07-13 DIAGNOSIS — R29898 Other symptoms and signs involving the musculoskeletal system: Secondary | ICD-10-CM

## 2015-07-13 DIAGNOSIS — M25661 Stiffness of right knee, not elsewhere classified: Secondary | ICD-10-CM

## 2015-07-13 NOTE — Therapy (Signed)
[place], [name], [address] Phone: [phone]   Fax:  [phone]  Physical Therapy Treatment  Patient Details  Name: Marc Garcia MRN: [id number] Date of Birth: 06/17/1956 Referring Provider:  Sydnee Cabal, MD  Encounter Date: 07/13/2015      PT End of Session - 07/13/15 1144    Visit Number 12   Number of Visits 15   Date for PT Re-Evaluation 07/13/15   Authorization Type [employer]   Authorization Time Period 06/15/15-08/10/15   Authorization - Visit Number 12   PT Start Time 1100   PT Stop Time 1141   PT Time Calculation (min) 41 min   Activity Tolerance Patient tolerated treatment well   Behavior During Therapy [facility] for tasks assessed/performed      Past Medical History  Diagnosis Date  . Borderline diabetic   . Hypertension   . Hyperlipidemia   . Emphysema of lung   . GERD (gastroesophageal reflux disease)   . Arthritis     Past Surgical History  Procedure Laterality Date  . Knee surgery Right     times 2  . Cystectomy    . Hernia repair      inguinal bilat   . Colonscopy     . Total knee arthroplasty Right 05/15/2015    Procedure: RIGHT TOTAL KNEE ARTHROPLASTY;  Surgeon: Sydnee Cabal, MD;  Location: [facility];  Service: Orthopedics;  Laterality: Right;    There were no vitals filed for this visit.  Visit Diagnosis:  Knee stiffness, right  Difficulty walking  Right leg weakness  Right knee pain      Subjective Assessment - 07/13/15 1105    Subjective Pt reporting he feels good today. He says mild increase in soreness in knee after extensive manual therapy last two sessions, but that it resolved rather quickly. Pt was very active in garden over weekend, kneeling adn working on [employer], and reports he worked diligently on his HEP.    Patient Stated Goals return to PLOF, be able to walk/climb stairs without knee pain   Currently in Pain? Yes   Pain Score 2    Pain Location Knee  2  or less, mostly stiffness.    Pain Orientation Right                         [facility] Adult PT Treatment/Exercise - 07/13/15 0001    Ambulation/Gait   Number of Stairs 16  Zero handrails, 5000g orange ball in arms   Height of Stairs 8   Knee/Hip Exercises: Stretches   Other Knee/Hip Stretches Prone R rectur femoris stretch MET  5s//30s x5 (contract relax)    Knee/Hip Exercises: Aerobic   Nustep Level 0, 7 minutes warmup  no charge   Knee/Hip Exercises: Seated   Long Arc Quad Right;1 set;Limitations;15 reps   Long Arc Quad Weight 0 lbs.   Long [employer] Limitations 5 second hold   Knee/Hip Exercises: Supine   Short Arc Quad Sets 2 sets;10 reps  5#, 7.5#   Knee/Hip Exercises: Prone   Other Prone Exercises terminal knee extension 10x 5"   Manual Therapy   Manual therapy comments Prone TibFem Anterior glide Gr III 3x45sec   Joint Mobilization Patella mobs: 2x60 med/lat; caudal-cranial; ant tib glide 3x45s  Tib-Fem distraction 3x30 seconds; tib-fem rotational 3x30sec                PT Education - 07/13/15 1143  Education provided Yes   Education Details Continue with home program for improving extension ROM.    Person(s) Educated Patient   Methods Explanation   Comprehension Verbalized understanding          PT Short Term Goals - 07/09/15 1534    PT SHORT TERM GOAL #1   Title Pt will be independent in HEP   Baseline 06/26/2015:  Reports compliance daily   Time 2   Period Weeks   Status Partially Met   PT SHORT TERM GOAL #2   Title Knee extension will improve to 0 degrees to improve knee extension during gait.    Baseline 09/08//2016 PROM 12-120 degrees   Time 2   Period Weeks   Status On-going   PT SHORT TERM GOAL #3   Title Improve knee flexion to 115 to improve ability to ascend/descend stairs.    Baseline 09/08//2016 PROM 12-120 degrees   Time 2   Period Weeks   Status Achieved   PT SHORT TERM GOAL #4   Title Improve quad, glut, and  hamstring strength to 4/5 to improve gait mechancis and functional mobility.    Baseline 9/8 strength is 5/5 throughout.    Time 2   Period Weeks   Status Achieved   PT SHORT TERM GOAL #5   Title Patient will be indepent with HEP.    Time 3   Period Weeks   Status Achieved           PT Long Term Goals - 07/09/15 1633    PT LONG TERM GOAL #1   Title Improve R knee flexion to 120 degrees to allow pt to descned stairs with reciprocal pattern.    Baseline 9/8 knee flexion 120 degrees   Time 4   Period Weeks   Status Achieved   PT LONG TERM GOAL #2   Title Pt will complete five time sit to stand test in 11 seconds to demonstrate improved functional mobility and BLE strength.    Baseline 9/8 performed in 10.8 seconds    Time 4   Period Weeks   Status Achieved   PT LONG TERM GOAL #3   Title Pt will maintain SLS on RLE x 15 seconds to demonstrate improved balance and decreased fall risk.    Time 4   Period Weeks   Status Achieved               Plan - 07/13/15 1145    Clinical Impression Statement Pt continues to show improved indep in advanced HEP and return to activities around house and garden. Pt continues to deomstrate soem extension ROM deficits, which are slow to repond to manual therapy techniques applied this session. Pt encouraged to continue with regular stretching at home to improve ROM, Asked pt to cut back to 1x/week for last 3 sessions to adequately track progress adn prepare for indep s/p DC from PT.    Pt will benefit from skilled therapeutic intervention in order to improve on the following deficits Abnormal gait;Decreased activity tolerance;Decreased balance;Decreased endurance;Decreased range of motion;Decreased strength;Difficulty walking   Rehab Potential Good   PT Frequency 1x / week  updated for last 3 visit.    PT Duration 8 weeks   PT Treatment/Interventions ADLs/Self Care Home Management;Cryotherapy;Gait training;Stair training;Functional mobility  training;Therapeutic activities;Therapeutic exercise;Balance training;Neuromuscular re-education;Manual techniques;Scar mobilization   PT Next Visit Plan Continue with current PT POC to improve gait mechanics, ROM and functional strengthening.  Continue prone SLR with 3# for gluteal strengthening.  PT Home Exercise Plan Seated knee flexion stretch, hanging off step calf stretch.    Consulted and Agree with Plan of Care Patient        Problem List Patient Active Problem List   Diagnosis Date Noted  . Osteoarthritis of right knee 05/15/2015  . S/P knee replacement 05/15/2015  . Lumbago 05/10/2011    Colan Laymon C 07/13/2015, 11:50 AM  11:51 AM  Etta Grandchild, PT, DPT [place] License # [id number]       [facility] [address] [place], [name], [address] Phone: [phone]   Fax:  [phone]

## 2015-07-15 ENCOUNTER — Encounter (HOSPITAL_COMMUNITY): Payer: Managed Care, Other (non HMO) | Admitting: Physical Therapy

## 2015-07-17 ENCOUNTER — Encounter (HOSPITAL_COMMUNITY): Payer: Managed Care, Other (non HMO)

## 2015-07-20 ENCOUNTER — Ambulatory Visit (HOSPITAL_COMMUNITY): Payer: Managed Care, Other (non HMO)

## 2015-07-20 DIAGNOSIS — R29898 Other symptoms and signs involving the musculoskeletal system: Secondary | ICD-10-CM

## 2015-07-20 DIAGNOSIS — R262 Difficulty in walking, not elsewhere classified: Secondary | ICD-10-CM

## 2015-07-20 DIAGNOSIS — Z96651 Presence of right artificial knee joint: Secondary | ICD-10-CM | POA: Diagnosis not present

## 2015-07-20 DIAGNOSIS — M25561 Pain in right knee: Secondary | ICD-10-CM

## 2015-07-20 DIAGNOSIS — M25661 Stiffness of right knee, not elsewhere classified: Secondary | ICD-10-CM

## 2015-07-20 NOTE — Therapy (Signed)
[place], [name], [address] Phone: [phone]   Fax:  [phone]  Physical Therapy Treatment  Patient Details  Name: Marc Garcia MRN: [id number] Date of Birth: 1956-04-22 Referring Provider:  Sydnee Cabal, MD  Encounter Date: 07/20/2015      PT End of Session - 07/20/15 1020    Visit Number 13   Number of Visits 15   Date for PT Re-Evaluation 07/13/15   Authorization Type [employer]   Authorization Time Period 06/15/15-08/10/15   Authorization - Visit Number 13   PT Start Time 6767   PT Stop Time 1017   PT Time Calculation (min) 46 min   Activity Tolerance Patient tolerated treatment well;No increased pain   Behavior During Therapy [facility] for tasks assessed/performed      Past Medical History  Diagnosis Date  . Borderline diabetic   . Hypertension   . Hyperlipidemia   . Emphysema of lung   . GERD (gastroesophageal reflux disease)   . Arthritis     Past Surgical History  Procedure Laterality Date  . Knee surgery Right     times 2  . Cystectomy    . Hernia repair      inguinal bilat   . Colonscopy     . Total knee arthroplasty Right 05/15/2015    Procedure: RIGHT TOTAL KNEE ARTHROPLASTY;  Surgeon: Sydnee Cabal, MD;  Location: [facility];  Service: Orthopedics;  Laterality: Right;    There were no vitals filed for this visit.  Visit Diagnosis:  Difficulty walking  Knee stiffness, right  Right leg weakness  Right knee pain      Subjective Assessment - 07/20/15 0936    Subjective Pt reporting that most everything is feeling the same in regards to his Right knee. The pain has not changed, except for some new medial pain upon waking this morning which resolved quickly adn spontanseously. Pt reports his HEP remains consistents and that his practice of stair climibing at home continues to presen tsignificant challenge and pain, which he follows with icing.    Patient Stated Goals return to PLOF,  be able to walk/climb stairs without knee pain   Currently in Pain? Yes   Pain Score 2    Pain Location Knee   Pain Orientation Right   Pain Descriptors / Indicators Sharp;Tightness   Pain Type Surgical pain                         [facility] Adult PT Treatment/Exercise - 07/20/15 0001    Knee/Hip Exercises: Aerobic   Stationary Bike 8' @ seat 9  no charge   Knee/Hip Exercises: Standing   Forward Step Up 2 sets;10 reps;Step Height: 6";Step Height: 2"  box + airex   Forward Step Up Limitations 5000g orange ball   Knee/Hip Exercises: Seated   Long Arc Quad Right;Limitations;10 reps;2 sets   Long Arc Quad Weight 5 lbs.   Marching Limitations R side, erect seated posture  2x10   Marching Weights 5 lbs.   Sit to Sand 2 sets;10 reps  R joint line pain (med & lat), elevated surface to help pain   Knee/Hip Exercises: Supine   Quad Sets 15 reps;Right;AROM  into towel    Short Arc Quad Sets 2 sets;10 reps;Right  7   Bridges 15 reps;2 sets   Manual Therapy   Manual therapy comments Prone TibFem Anterior glide Gr III 3x45sec   Joint Mobilization Patella  mobs: 2x60 med/lat; caudal-cranial; ant tib glide 3x45s  Tib-Fem distraction 3x30 seconds; tib-fem rotational 3x30sec                PT Education - 07/20/15 0938    Education provided No          PT Short Term Goals - 07/09/15 1534    PT SHORT TERM GOAL #1   Title Pt will be independent in HEP   Baseline 06/26/2015:  Reports compliance daily   Time 2   Period Weeks   Status Partially Met   PT SHORT TERM GOAL #2   Title Knee extension will improve to 0 degrees to improve knee extension during gait.    Baseline 09/08//2016 PROM 12-120 degrees   Time 2   Period Weeks   Status On-going   PT SHORT TERM GOAL #3   Title Improve knee flexion to 115 to improve ability to ascend/descend stairs.    Baseline 09/08//2016 PROM 12-120 degrees   Time 2   Period Weeks   Status Achieved   PT SHORT TERM GOAL #4    Title Improve quad, glut, and hamstring strength to 4/5 to improve gait mechancis and functional mobility.    Baseline 9/8 strength is 5/5 throughout.    Time 2   Period Weeks   Status Achieved   PT SHORT TERM GOAL #5   Title Patient will be indepent with HEP.    Time 3   Period Weeks   Status Achieved           PT Long Term Goals - 07/09/15 1633    PT LONG TERM GOAL #1   Title Improve R knee flexion to 120 degrees to allow pt to descned stairs with reciprocal pattern.    Baseline 9/8 knee flexion 120 degrees   Time 4   Period Weeks   Status Achieved   PT LONG TERM GOAL #2   Title Pt will complete five time sit to stand test in 11 seconds to demonstrate improved functional mobility and BLE strength.    Baseline 9/8 performed in 10.8 seconds    Time 4   Period Weeks   Status Achieved   PT LONG TERM GOAL #3   Title Pt will maintain SLS on RLE x 15 seconds to demonstrate improved balance and decreased fall risk.    Time 4   Period Weeks   Status Achieved               Plan - 07/20/15 1021    Clinical Impression Statement Pt continues to demonstrate advances in strength and functional mobiltty at home and inclinic. Pt show most continued difficulty with improving ROM into extension and flexion, resulting in continued discomfort with prolonged positioning. Extensive manual therapy reveals continued tightness in the knee capsule, limting accessory joint mobility,     Pt will benefit from skilled therapeutic intervention in order to improve on the following deficits Abnormal gait;Decreased activity tolerance;Decreased balance;Decreased endurance;Decreased range of motion;Decreased strength;Difficulty walking   Rehab Potential Good   PT Frequency 1x / week   PT Duration 8 weeks   PT Treatment/Interventions ADLs/Self Care Home Management;Cryotherapy;Gait training;Stair training;Functional mobility training;Therapeutic activities;Therapeutic exercise;Balance  training;Neuromuscular re-education;Manual techniques;Scar mobilization   PT Next Visit Plan Continue with current PT POC to improve gait mechanics, ROM and functional strengthening.  Continue prone SLR with 3# for gluteal strengthening.     PT Home Exercise Plan No changes.   Consulted and Agree with Plan of Care Patient  Problem List Patient Active Problem List   Diagnosis Date Noted  . Osteoarthritis of right knee 05/15/2015  . S/P knee replacement 05/15/2015  . Lumbago 05/10/2011    Buccola,Allan C 07/20/2015, 10:28 AM  10:28 AM  Etta Grandchild, PT, DPT [place] License # [id number]       [facility] [address] [place], [name], [address] Phone: [phone]   Fax:  [phone]

## 2015-07-20 NOTE — Patient Instructions (Signed)
Continue to HEP as previously indicated. No changes at this time.

## 2015-07-22 ENCOUNTER — Encounter (HOSPITAL_COMMUNITY): Payer: Managed Care, Other (non HMO) | Admitting: Physical Therapy

## 2015-07-23 ENCOUNTER — Encounter (HOSPITAL_COMMUNITY): Payer: Managed Care, Other (non HMO)

## 2015-07-24 ENCOUNTER — Encounter (HOSPITAL_COMMUNITY): Payer: Managed Care, Other (non HMO)

## 2015-09-02 ENCOUNTER — Ambulatory Visit (HOSPITAL_COMMUNITY): Payer: Managed Care, Other (non HMO) | Attending: Specialist

## 2015-09-02 DIAGNOSIS — M25612 Stiffness of left shoulder, not elsewhere classified: Secondary | ICD-10-CM

## 2015-09-02 DIAGNOSIS — M629 Disorder of muscle, unspecified: Secondary | ICD-10-CM | POA: Diagnosis present

## 2015-09-02 DIAGNOSIS — Z9889 Other specified postprocedural states: Secondary | ICD-10-CM | POA: Diagnosis not present

## 2015-09-02 DIAGNOSIS — R29898 Other symptoms and signs involving the musculoskeletal system: Secondary | ICD-10-CM | POA: Insufficient documentation

## 2015-09-02 DIAGNOSIS — M25512 Pain in left shoulder: Secondary | ICD-10-CM | POA: Diagnosis present

## 2015-09-02 DIAGNOSIS — M6289 Other specified disorders of muscle: Secondary | ICD-10-CM

## 2015-09-02 NOTE — Patient Instructions (Signed)
TOWEL SLIDES COMPLETE FOR 1-3 MINUTES, 3-5 TIMES PER DAY  SHOULDER: Flexion On Table   Place hands on table, elbows straight. Move hips away from body. Press hands down into table. Hold ___ seconds. ___ reps per set, ___ sets per day, ___ days per week  Abduction (Passive)   With arm out to side, resting on table, lower head toward arm, keeping trunk away from table. Hold ____ seconds. Repeat ____ times. Do ____ sessions per day.  Copyright  VHI. All rights reserved.     Internal Rotation (Assistive)   Seated with elbow bent at right angle and held against side, slide arm on table surface in an inward arc. Repeat ____ times. Do ____ sessions per day. Activity: Use this motion to brush crumbs off the table.  Copyright  VHI. All rights reserved.    COMPLETE PENDULUM EXERCISES FOR 30 SECONDS TO A MINUTE EACH, 3-5 TIMES PER DAY. ROM: Pendulum (Side-to-Side)      Pendulum Forward/Back   Bend forward 90 at waist, using table for support. Rock body forward and back to swing arm. Repeat ____ times. Do ____ sessions per day.  Copyright  VHI. All rights reserved.  Pendulum Circular   Bend forward 90 at waist, leaning on table for support. Rock body in a circular pattern to move arm clockwise ____ times then counterclockwise ____ times. Do ____ sessions per day.  Copyright  VHI. All rights reserved.  AROM: Wrist Extension   With right palm down, bend wrist up. Repeat 10____ times per set. Do ____ sets per session. Do __3__ sessions per day.  Copyright  VHI. All rights reserved.   AROM: Wrist Flexion   With right palm up, bend wrist up. Repeat ___10_ times per set. Do ____ sets per session. Do __3__ sessions per day.  Copyright  VHI. All rights reserved.   AROM: Forearm Pronation / Supination   With right arm in handshake position, slowly rotate palm down until stretch is felt. Relax. Then rotate palm up until stretch is felt. Repeat __10__ times per set.  Do ____ sets per session. Do __3__ sessions per day.  AROM: Elbow Flexion / Extension    With left hand palm up, gently bend elbow as far as possible. Then straighten arm as far as possible. Repeat _10___ times per set. Do __1__ sets per session. Do _3___ sessions per day.  Copyright  VHI. All rights reserved.

## 2015-09-02 NOTE — Therapy (Addendum)
[place], [name], [address] Phone: [phone]   Fax:  [phone]  Occupational Therapy Evaluation  Patient Details  Name: Marc Garcia MRN: [id number] Date of Birth: December 12, 1955 Referring Provider: Sydnee Cabal, MD  Encounter Date: 09/02/2015      OT End of Session - 09/02/15 1224    Visit Number 1   Number of Visits 8   Date for OT Re-Evaluation 11/01/15  Mini reassess: 09/30/15   Authorization Type [employer]   Authorization Time Period 90 combined visits OT/PT/SLP (pt has used 24 this year)   Authorization - Visit Number 25   Authorization - Number of Visits 90   OT Start Time 1100   OT Stop Time 1145   OT Time Calculation (min) 45 min   Activity Tolerance Patient tolerated treatment well   Behavior During Therapy [facility] for tasks assessed/performed      Past Medical History  Diagnosis Date  . Borderline diabetic   . Hypertension   . Hyperlipidemia   . Emphysema of lung   . GERD (gastroesophageal reflux disease)   . Arthritis     Past Surgical History  Procedure Laterality Date  . Knee surgery Right     times 2  . Cystectomy    . Hernia repair      inguinal bilat   . Colonscopy     . Total knee arthroplasty Right 05/15/2015    Procedure: RIGHT TOTAL KNEE ARTHROPLASTY;  Surgeon: Sydnee Cabal, MD;  Location: [facility];  Service: Orthopedics;  Laterality: Right;    There were no vitals filed for this visit.  Visit Diagnosis:  S/P shoulder surgery - Plan: Ot plan of care cert/re-cert  Shoulder weakness - Plan: Ot plan of care cert/re-cert  Stiffness of joint, shoulder region, left - Plan: Ot plan of care cert/re-cert  Tight fascia - Plan: Ot plan of care cert/re-cert      Subjective Assessment - 09/02/15 1203    Subjective  S: I had surgery yesterday but the doctor said it wasn't as bad as he thought.   Pertinent History Patient is a 59 y/o male S/P left SAd/DCR/labral debridement which  occured on 09/01/15. patient arrives to clinic in a sling although states that he was instructed to wear it for comfort. He was not told to keep it on 24/7. Dr. Theda Sers has referred patient to occpational therapy for evaluation and treatment.    Special Tests FOTO score: 39/100   Patient Stated Goals To be able to use my arm normally.   Currently in Pain? Yes   Pain Score 5    Pain Location Shoulder   Pain Orientation Left   Pain Descriptors / Indicators Sore;Aching   Pain Type Surgical pain           [facility] OT Assessment - 09/02/15 1104    Assessment   Diagnosis Left shoulder SAD/DCR/labral debridement   Referring Provider Sydnee Cabal, MD   Onset Date 09/01/15  surgery   Prior Therapy None   Precautions   Precautions Shoulder   Type of Shoulder Precautions No protocol sent by MD. Patient was not given any precautions. Start with P/ROM for 4 weeks, progress to AA/ROM and A/ROM as patient is able to tolerate.    Shoulder Interventions Shoulder sling/immobilizer  wear for comfort   Restrictions   Weight Bearing Restrictions No   Balance Screen   Has the patient fallen in the past 6 months No   Home  Environment   Family/patient expects to be discharged to: Private residence   Prior Function   Level of Independence Independent   Vocation --  Will retire at end of the year.   ADL   ADL comments Difficulty using left arm for any daily task due to recent surgery.   Mobility   Mobility Status Independent   Written Expression   Dominant Hand Right   Vision - History   Baseline Vision Wears glasses all the time   Cognition   Overall Cognitive Status Within Functional Limits for tasks assessed   Observation/Other Assessments   Focus on Therapeutic Outcomes (FOTO)  39/100   ROM / Strength   AROM / PROM / Strength AROM;PROM;Strength   Palpation   Palpation comment Increased edema in left shoulder. Max fascial restrictions in left upper arm, trapezius, and scapularis region.     AROM   Overall AROM  Unable to assess;Due to precautions   AROM Assessment Site Shoulder   Right/Left Shoulder Left   PROM   PROM Assessment Site Shoulder   Right/Left Shoulder Left   Left Shoulder Flexion 120 Degrees   Left Shoulder ABduction 90 Degrees   Left Shoulder Internal Rotation 90 Degrees   Left Shoulder External Rotation 43 Degrees   Strength   Overall Strength Unable to assess;Due to precautions   Strength Assessment Site Shoulder   Right/Left Shoulder Left                         OT Education - 09/02/15 1223    Education provided Yes   Education Details Table slides, A/ROM elbow and wrist exercises, pendulums    Person(s) Educated Patient   Methods Explanation;Demonstration;Handout   Comprehension Returned demonstration;Verbalized understanding          OT Short Term Goals - 09/02/15 1239    OT SHORT TERM GOAL #1   Title Patient will be educated and independent with HEP to increase functional use of LUE.    Time 3   Period Weeks   Status New   OT SHORT TERM GOAL #2   Title Patient will increase P/ROM of LUE to [facility] to increase ability to get shirts on and off.    Time 3   Period Weeks   Status New   OT SHORT TERM GOAL #3   Title Patient will decrease pain in LUE to 3/10 or less when completing daily tasks.    Time 3   Period Weeks   Status New   OT SHORT TERM GOAL #4   Title Patient will decrease fascial restrictions to mod amount to increase functional mobility of LUE during daily tasks.    Time 3   Period Weeks   Status New   OT SHORT TERM GOAL #5   Title Patient will increase muscle strength in LUE to 3/5 to increase ability to reach above shoulder height.    Time 3   Period Weeks   Status New           OT Long Term Goals - 09/02/15 1250    OT LONG TERM GOAL #1   Title Patient will return to highest level of independence using LUE during all daily and leisure tasks.    Time 6   Period Weeks   Status New   OT LONG  TERM GOAL #2   Title Patient will Increase LUE strength to 4-/5 to increase ability to complete all daily household tasks.   Time  6   Period Weeks   Status New   OT LONG TERM GOAL #3   Title Patient will increase A/ROM of LUE to WNL to increase ability to complete all overhead activities.    Time 6   Period Weeks   Status New   OT LONG TERM GOAL #4   Title Patient will decrese fascial restrictions to min amount to increase functional mobility of LUE during daily tasks.    Time 6   Period Weeks   Status New   OT LONG TERM GOAL #5   Title Patient will decrease pain level in LUE to 1/10 or less to increase ability to complete daily tasks with less difficulty.    Time 6   Period Weeks   Status New               Plan - 09/02/15 1227    Clinical Impression Statement A: Patient is a 59 y/o male S/P left shoulder SAD/DCR/labral debridement causing increased pain and fascial restrictions and decreased strength and ROM resulting in difficulty completing daily tasks using LUE.   Pt will benefit from skilled therapeutic intervention in order to improve on the following deficits (Retired) Decreased strength;Pain;Impaired UE functional use;Increased fascial restricitons;Decreased range of motion   Rehab Potential Excellent   OT Frequency 2x / week   OT Duration 4 weeks   OT Treatment/Interventions Self-care/ADL training;Ultrasound;Cryotherapy;Electrical Stimulation;Moist Heat;Therapeutic activities;Therapeutic exercise;Manual Therapy;Passive range of motion;Patient/family education   Plan P: Patient will benefit from skilled OT services to increase functional performance during daily tasks using LUE. Treatment Plan: myofascial release, manual stretching, AA/ROM, A/ROM, general strengthening.    Consulted and Agree with Plan of Care Patient        Problem List Patient Active Problem List   Diagnosis Date Noted  . Osteoarthritis of right knee 05/15/2015  . S/P knee replacement  05/15/2015  . Lumbago 05/10/2011    Ailene Ravel, OTR/L,CBIS  [phone]  09/02/2015, 4:27 PM  [place] [address] [place], [name], [address] Phone: [phone]   Fax:  [phone]  Name: Marc Garcia MRN: [id number] Date of Birth: [birth year]

## 2015-09-04 ENCOUNTER — Ambulatory Visit (HOSPITAL_COMMUNITY): Payer: Managed Care, Other (non HMO)

## 2015-09-04 ENCOUNTER — Encounter (HOSPITAL_COMMUNITY): Payer: Self-pay

## 2015-09-04 DIAGNOSIS — M6289 Other specified disorders of muscle: Secondary | ICD-10-CM

## 2015-09-04 DIAGNOSIS — Z9889 Other specified postprocedural states: Secondary | ICD-10-CM | POA: Diagnosis not present

## 2015-09-04 DIAGNOSIS — R29898 Other symptoms and signs involving the musculoskeletal system: Secondary | ICD-10-CM

## 2015-09-04 DIAGNOSIS — M25612 Stiffness of left shoulder, not elsewhere classified: Secondary | ICD-10-CM

## 2015-09-04 DIAGNOSIS — M629 Disorder of muscle, unspecified: Secondary | ICD-10-CM

## 2015-09-04 NOTE — Therapy (Signed)
[place], [name], [address] Phone: [phone]   Fax:  [phone]  Occupational Therapy Treatment  Patient Details  Name: Marc Garcia MRN: [id number] Date of Birth: 09-01-1956 Referring Provider: Sydnee Cabal, MD  Encounter Date: 09/04/2015      OT End of Session - 09/04/15 0914    Visit Number 2   Number of Visits 8   Date for OT Re-Evaluation 11/01/15  Mini reassess: 09/30/15   Authorization Type [employer]   Authorization Time Period 90 combined visits OT/PT/SLP (pt has used 24 this year)   Authorization - Visit Number 26   Authorization - Number of Visits 69   OT Start Time 0845   OT Stop Time 0930   OT Time Calculation (min) 45 min   Activity Tolerance Patient tolerated treatment well   Behavior During Therapy [facility] for tasks assessed/performed      Past Medical History  Diagnosis Date  . Borderline diabetic   . Hypertension   . Hyperlipidemia   . Emphysema of lung ([place])   . GERD (gastroesophageal reflux disease)   . Arthritis     Past Surgical History  Procedure Laterality Date  . Knee surgery Right     times 2  . Cystectomy    . Hernia repair      inguinal bilat   . Colonscopy     . Total knee arthroplasty Right 05/15/2015    Procedure: RIGHT TOTAL KNEE ARTHROPLASTY;  Surgeon: Sydnee Cabal, MD;  Location: [facility];  Service: Orthopedics;  Laterality: Right;    There were no vitals filed for this visit.  Visit Diagnosis:  Shoulder weakness  Stiffness of joint, shoulder region, left  Tight fascia      Subjective Assessment - 09/04/15 0909    Subjective  S: I haven't put my sling on since I was here.    Currently in Pain? Yes   Pain Score 2    Pain Location Shoulder   Pain Orientation Left   Pain Descriptors / Indicators Aching;Sore   Pain Type Surgical pain            [facility] OT Assessment - 09/04/15 0911    Assessment   Diagnosis Left shoulder SAD/DCR/labral  debridement   Precautions   Precautions Shoulder   Type of Shoulder Precautions No protocol sent by MD. Patient was not given any precautions. Start with P/ROM for 4 weeks, progress to AA/ROM and A/ROM as patient is able to tolerate.    Shoulder Interventions Shoulder sling/immobilizer                  OT Treatments/Exercises (OP) - 09/04/15 0911    Exercises   Exercises Shoulder   Shoulder Exercises: Supine   Protraction PROM;10 reps   Horizontal ABduction PROM;10 reps   External Rotation PROM;10 reps   Internal Rotation PROM;10 reps   Flexion PROM;10 reps   ABduction PROM;10 reps   Shoulder Exercises: Seated   Elevation AROM;10 reps   Extension AROM;10 reps   Row AROM;10 reps   Other Seated Exercises A/ROM elbow flexion/extension 12X    Shoulder Exercises: Therapy Ball   Flexion 15 reps   ABduction 15 reps   Shoulder Exercises: ROM/Strengthening   Anterior Glide 3x10"   Caudal Glide 3x10"   Shoulder Exercises: Isometric Strengthening   Flexion Supine;3X3"   Extension Supine;3X3"   External Rotation Supine;3X3"   Internal Rotation Supine;3X3"   ABduction Supine;3X3"   ADduction Supine;3X3"  Manual Therapy   Manual Therapy Myofascial release   Myofascial Release Myofascial release to left upper arm, trapezius, and scapularis region to decrease fascial restrictions and increase joint mobility in a pain free zone.                   OT Short Term Goals - 09/04/15 0913    OT SHORT TERM GOAL #1   Title Patient will be educated and independent with HEP to increase functional use of LUE.    Status On-going   OT SHORT TERM GOAL #2   Title Patient will increase P/ROM of LUE to [facility] to increase ability to get shirts on and off.    Status On-going   OT SHORT TERM GOAL #3   Title Patient will decrease pain in LUE to 3/10 or less when completing daily tasks.    Status On-going   OT SHORT TERM GOAL #4   Title Patient will decrease fascial restrictions to mod  amount to increase functional mobility of LUE during daily tasks.    Status On-going   OT SHORT TERM GOAL #5   Title Patient will increase muscle strength in LUE to 3/5 to increase ability to reach above shoulder height.    Status On-going           OT Long Term Goals - 09/04/15 0914    OT LONG TERM GOAL #1   Title Patient will return to highest level of independence using LUE during all daily and leisure tasks.    Status On-going   OT LONG TERM GOAL #2   Title Patient will Increase LUE strength to 4-/5 to increase ability to complete all daily household tasks.   Status On-going   OT LONG TERM GOAL #3   Title Patient will increase A/ROM of LUE to WNL to increase ability to complete all overhead activities.    Status On-going   OT LONG TERM GOAL #4   Title Patient will decrese fascial restrictions to min amount to increase functional mobility of LUE during daily tasks.    Status On-going   OT LONG TERM GOAL #5   Title Patient will decrease pain level in LUE to 1/10 or less to increase ability to complete daily tasks with less difficulty.    Status On-going               Plan - 09/04/15 0924    Clinical Impression Statement A: Initiated myofascial release, passive stretching, therapy ball exercises and seated scapular A/ROM. Pt required VC to slow down speed and for form and technique. Overall, patient did great.    Plan P: Print out OT evaluation and review goals with patient.         Problem List Patient Active Problem List   Diagnosis Date Noted  . Osteoarthritis of right knee 05/15/2015  . S/P knee replacement 05/15/2015  . Lumbago 05/10/2011    Ailene Ravel, OTR/L,CBIS  [phone]  09/04/2015, 9:26 AM  [place] [address] [place], [name], [address] Phone: [phone]   Fax:  [phone]  Name: Marc Garcia MRN: [id number] Date of Birth: 10-29-1956

## 2015-09-07 ENCOUNTER — Ambulatory Visit (HOSPITAL_COMMUNITY): Payer: Managed Care, Other (non HMO)

## 2015-09-07 ENCOUNTER — Encounter (HOSPITAL_COMMUNITY): Payer: Self-pay

## 2015-09-07 DIAGNOSIS — M25612 Stiffness of left shoulder, not elsewhere classified: Secondary | ICD-10-CM

## 2015-09-07 DIAGNOSIS — M25512 Pain in left shoulder: Secondary | ICD-10-CM

## 2015-09-07 DIAGNOSIS — R29898 Other symptoms and signs involving the musculoskeletal system: Secondary | ICD-10-CM

## 2015-09-07 DIAGNOSIS — M6289 Other specified disorders of muscle: Secondary | ICD-10-CM

## 2015-09-07 DIAGNOSIS — Z9889 Other specified postprocedural states: Secondary | ICD-10-CM | POA: Diagnosis not present

## 2015-09-07 DIAGNOSIS — M629 Disorder of muscle, unspecified: Secondary | ICD-10-CM

## 2015-09-07 NOTE — Therapy (Signed)
[place], [name], [address] Phone: [phone]   Fax:  [phone]  Occupational Therapy Treatment  Patient Details  Name: Marc Garcia MRN: [id number] Date of Birth: Aug 21, 1956 Referring Provider: Sydnee Cabal, MD  Encounter Date: 09/07/2015      OT End of Session - 09/07/15 1233    Visit Number 3   Number of Visits 8   Date for OT Re-Evaluation 11/01/15  Mini reassess: 09/30/15   Authorization Type [employer]   Authorization Time Period 90 combined visits OT/PT/SLP (pt has used 24 this year)   Authorization - Visit Number 30   Authorization - Number of Visits 90   OT Start Time 1100   OT Stop Time 1145   OT Time Calculation (min) 45 min   Activity Tolerance Patient tolerated treatment well   Behavior During Therapy [facility] for tasks assessed/performed      Past Medical History  Diagnosis Date  . Borderline diabetic   . Hypertension   . Hyperlipidemia   . Emphysema of lung ([place])   . GERD (gastroesophageal reflux disease)   . Arthritis     Past Surgical History  Procedure Laterality Date  . Knee surgery Right     times 2  . Cystectomy    . Hernia repair      inguinal bilat   . Colonscopy     . Total knee arthroplasty Right 05/15/2015    Procedure: RIGHT TOTAL KNEE ARTHROPLASTY;  Surgeon: Sydnee Cabal, MD;  Location: [facility];  Service: Orthopedics;  Laterality: Right;    There were no vitals filed for this visit.  Visit Diagnosis:  Stiffness of joint, shoulder region, left  Shoulder weakness  Tight fascia  Pain in joint of left shoulder      Subjective Assessment - 09/07/15 1128    Subjective  S: It's a little sore but it's not stopping me from doing anything.   Currently in Pain? Yes   Pain Score 4    Pain Location Shoulder   Pain Orientation Left   Pain Descriptors / Indicators Aching;Sore   Pain Type Surgical pain            [facility] OT Assessment - 09/07/15 1130     Assessment   Diagnosis Left shoulder SAD/DCR/labral debridement   Precautions   Precautions Shoulder   Type of Shoulder Precautions No protocol sent by MD. Patient was not given any precautions. Start with P/ROM for 4 weeks, progress to AA/ROM and A/ROM as patient is able to tolerate.    Shoulder Interventions Shoulder sling/immobilizer;For comfort                  OT Treatments/Exercises (OP) - 09/07/15 1130    Exercises   Exercises Shoulder   Shoulder Exercises: Supine   Protraction PROM;10 reps   Horizontal ABduction PROM;10 reps   External Rotation PROM;10 reps   Internal Rotation PROM;10 reps   Flexion PROM;10 reps   ABduction PROM;10 reps   Shoulder Exercises: Seated   Elevation AROM;15 reps   Extension AROM;15 reps   Row AROM;15 reps   Shoulder Exercises: Therapy Ball   Flexion 15 reps   ABduction 15 reps   Shoulder Exercises: ROM/Strengthening   Thumb Tacks 1'   Anterior Glide 3x10"   Caudal Glide 3x10"   Shoulder Exercises: Isometric Strengthening   Flexion Supine;5X5"   Extension Supine;5X5"   External Rotation Supine;5X5"   Internal Rotation Supine;5X5"   ABduction Supine;5X5"  ADduction Supine;5X5"   Manual Therapy   Manual Therapy Myofascial release   Myofascial Release Myofascial release to left upper arm, trapezius, and scapularis region to decrease fascial restrictions and increase joint mobility in a pain free zone.                 OT Education - 09/07/15 1232    Education provided Yes   Education Details Pt given OT evaluation handout. Reviewed plan of care and therapy goals.   Person(s) Educated Patient   Methods Explanation;Handout   Comprehension Verbalized understanding          OT Short Term Goals - 09/04/15 0913    OT SHORT TERM GOAL #1   Title Patient will be educated and independent with HEP to increase functional use of LUE.    Status On-going   OT SHORT TERM GOAL #2   Title Patient will increase P/ROM of LUE to  [facility] to increase ability to get shirts on and off.    Status On-going   OT SHORT TERM GOAL #3   Title Patient will decrease pain in LUE to 3/10 or less when completing daily tasks.    Status On-going   OT SHORT TERM GOAL #4   Title Patient will decrease fascial restrictions to mod amount to increase functional mobility of LUE during daily tasks.    Status On-going   OT SHORT TERM GOAL #5   Title Patient will increase muscle strength in LUE to 3/5 to increase ability to reach above shoulder height.    Status On-going           OT Long Term Goals - 09/04/15 0914    OT LONG TERM GOAL #1   Title Patient will return to highest level of independence using LUE during all daily and leisure tasks.    Status On-going   OT LONG TERM GOAL #2   Title Patient will Increase LUE strength to 4-/5 to increase ability to complete all daily household tasks.   Status On-going   OT LONG TERM GOAL #3   Title Patient will increase A/ROM of LUE to WNL to increase ability to complete all overhead activities.    Status On-going   OT LONG TERM GOAL #4   Title Patient will decrese fascial restrictions to min amount to increase functional mobility of LUE during daily tasks.    Status On-going   OT LONG TERM GOAL #5   Title Patient will decrease pain level in LUE to 1/10 or less to increase ability to complete daily tasks with less difficulty.    Status On-going               Plan - 09/07/15 1233    Clinical Impression Statement A: Added thumb tacks this session. patient reports that he was able to really feel the effects of exercise. VC to slow down movements during all exercises.    Plan P: Add pro/ret/elev/dep. Follow up on MD appt.         Problem List Patient Active Problem List   Diagnosis Date Noted  . Osteoarthritis of right knee 05/15/2015  . S/P knee replacement 05/15/2015  . Lumbago 05/10/2011   Ailene Ravel, OTR/L,CBIS  [phone]  09/07/2015, 12:35 PM  [place] [address] [place], [name], [address] Phone: [phone]   Fax:  [phone]  Name: Marc Garcia MRN: [id number] Date of Birth: 04/18/1956

## 2015-09-09 ENCOUNTER — Ambulatory Visit (HOSPITAL_COMMUNITY): Payer: Managed Care, Other (non HMO) | Admitting: Occupational Therapy

## 2015-09-09 ENCOUNTER — Encounter (HOSPITAL_COMMUNITY): Payer: Self-pay | Admitting: Occupational Therapy

## 2015-09-09 DIAGNOSIS — M25512 Pain in left shoulder: Secondary | ICD-10-CM

## 2015-09-09 DIAGNOSIS — Z9889 Other specified postprocedural states: Secondary | ICD-10-CM | POA: Diagnosis not present

## 2015-09-09 DIAGNOSIS — M25612 Stiffness of left shoulder, not elsewhere classified: Secondary | ICD-10-CM

## 2015-09-09 DIAGNOSIS — M629 Disorder of muscle, unspecified: Secondary | ICD-10-CM

## 2015-09-09 DIAGNOSIS — R29898 Other symptoms and signs involving the musculoskeletal system: Secondary | ICD-10-CM

## 2015-09-09 DIAGNOSIS — M6289 Other specified disorders of muscle: Secondary | ICD-10-CM

## 2015-09-09 NOTE — Therapy (Signed)
[place], [name], [address] Phone: [phone]   Fax:  [phone]  Occupational Therapy Treatment  Patient Details  Name: Marc Garcia MRN: [id number] Date of Birth: 1955-11-25 Referring Provider: Sydnee Cabal, MD  Encounter Date: 09/09/2015      OT End of Session - 09/09/15 1604    Visit Number 4   Number of Visits 8   Date for OT Re-Evaluation 11/01/15  Mini reassess: 09/30/15   Authorization Type [employer]   Authorization Time Period 90 combined visits OT/PT/SLP (pt has used 24 this year)   Authorization - Visit Number 28   Authorization - Number of Visits 25   OT Start Time 1516   OT Stop Time 1559   OT Time Calculation (min) 43 min   Activity Tolerance Patient tolerated treatment well   Behavior During Therapy [facility] for tasks assessed/performed      Past Medical History  Diagnosis Date  . Borderline diabetic   . Hypertension   . Hyperlipidemia   . Emphysema of lung ([place])   . GERD (gastroesophageal reflux disease)   . Arthritis     Past Surgical History  Procedure Laterality Date  . Knee surgery Right     times 2  . Cystectomy    . Hernia repair      inguinal bilat   . Colonscopy     . Total knee arthroplasty Right 05/15/2015    Procedure: RIGHT TOTAL KNEE ARTHROPLASTY;  Surgeon: Sydnee Cabal, MD;  Location: [facility];  Service: Orthopedics;  Laterality: Right;    There were no vitals filed for this visit.  Visit Diagnosis:  Stiffness of joint, shoulder region, left  Shoulder weakness  Tight fascia  Pain in joint of left shoulder      Subjective Assessment - 09/09/15 1517    Subjective  S: The doctor said it's looking good, that the bruising was normal.    Currently in Pain? Yes   Pain Score 2    Pain Location Shoulder   Pain Orientation Left   Pain Descriptors / Indicators Aching;Sore   Pain Type Acute pain            [facility] OT Assessment - 09/09/15 1603    Assessment    Diagnosis Left shoulder SAD/DCR/labral debridement   Precautions   Precautions Shoulder   Type of Shoulder Precautions No protocol sent by MD. Patient was not given any precautions. Start with P/ROM for 4 weeks, progress to AA/ROM and A/ROM as patient is able to tolerate.    Shoulder Interventions Shoulder sling/immobilizer;For comfort                  OT Treatments/Exercises (OP) - 09/09/15 1519    Exercises   Exercises Shoulder   Shoulder Exercises: Supine   Protraction PROM;10 reps   Horizontal ABduction PROM;10 reps   External Rotation PROM;10 reps   Internal Rotation PROM;10 reps   Flexion PROM;10 reps   ABduction PROM;10 reps   Shoulder Exercises: Seated   Elevation AROM;15 reps   Extension AROM;15 reps   Row AROM;15 reps   Shoulder Exercises: Therapy Ball   Flexion 15 reps   ABduction 15 reps   Shoulder Exercises: ROM/Strengthening   Thumb Tacks 1'   Prot/Ret//Elev/Dep 1'   Shoulder Exercises: Isometric Strengthening   Flexion Supine;5X5"   Extension Supine;5X5"   External Rotation Supine;5X5"   Internal Rotation Supine;5X5"   ABduction Supine;5X5"   ADduction Supine;5X5"   Manual  Therapy   Manual Therapy Myofascial release   Myofascial Release Myofascial release to left upper arm, trapezius, and scapularis region to decrease fascial restrictions and increase joint mobility in a pain free zone.                   OT Short Term Goals - 09/04/15 0913    OT SHORT TERM GOAL #1   Title Patient will be educated and independent with HEP to increase functional use of LUE.    Status On-going   OT SHORT TERM GOAL #2   Title Patient will increase P/ROM of LUE to [facility] to increase ability to get shirts on and off.    Status On-going   OT SHORT TERM GOAL #3   Title Patient will decrease pain in LUE to 3/10 or less when completing daily tasks.    Status On-going   OT SHORT TERM GOAL #4   Title Patient will decrease fascial restrictions to mod amount to  increase functional mobility of LUE during daily tasks.    Status On-going   OT SHORT TERM GOAL #5   Title Patient will increase muscle strength in LUE to 3/5 to increase ability to reach above shoulder height.    Status On-going           OT Long Term Goals - 09/04/15 0914    OT LONG TERM GOAL #1   Title Patient will return to highest level of independence using LUE during all daily and leisure tasks.    Status On-going   OT LONG TERM GOAL #2   Title Patient will Increase LUE strength to 4-/5 to increase ability to complete all daily household tasks.   Status On-going   OT LONG TERM GOAL #3   Title Patient will increase A/ROM of LUE to WNL to increase ability to complete all overhead activities.    Status On-going   OT LONG TERM GOAL #4   Title Patient will decrese fascial restrictions to min amount to increase functional mobility of LUE during daily tasks.    Status On-going   OT LONG TERM GOAL #5   Title Patient will decrease pain level in LUE to 1/10 or less to increase ability to complete daily tasks with less difficulty.    Status On-going               Plan - 09/09/15 1604    Clinical Impression Statement A: Added prot/ret/elev/dep this session. Pt required consistent verbal cuing for form and correct technique. Pt with range of motion WFL during passive stretching exercises. Pt reports MD removed stitches and said his shoulder is looking good.    Plan P: Continue to follow protocol working to achieve full P/ROM.         Problem List Patient Active Problem List   Diagnosis Date Noted  . Osteoarthritis of right knee 05/15/2015  . S/P knee replacement 05/15/2015  . Lumbago 05/10/2011    Guadelupe Sabin, OTR/L  [phone]  09/09/2015, 4:07 PM  [place] [address] [place], [name], [address] Phone: [phone]   Fax:  [phone]  Name: Marc Garcia MRN: [id number] Date of Birth: 1956/10/13

## 2015-09-16 ENCOUNTER — Encounter (HOSPITAL_COMMUNITY): Payer: Self-pay | Admitting: Occupational Therapy

## 2015-09-16 ENCOUNTER — Ambulatory Visit (HOSPITAL_COMMUNITY): Payer: Managed Care, Other (non HMO) | Admitting: Occupational Therapy

## 2015-09-16 DIAGNOSIS — Z9889 Other specified postprocedural states: Secondary | ICD-10-CM | POA: Diagnosis not present

## 2015-09-16 DIAGNOSIS — M25512 Pain in left shoulder: Secondary | ICD-10-CM

## 2015-09-16 DIAGNOSIS — M629 Disorder of muscle, unspecified: Secondary | ICD-10-CM

## 2015-09-16 DIAGNOSIS — R29898 Other symptoms and signs involving the musculoskeletal system: Secondary | ICD-10-CM

## 2015-09-16 DIAGNOSIS — M25612 Stiffness of left shoulder, not elsewhere classified: Secondary | ICD-10-CM

## 2015-09-16 DIAGNOSIS — M6289 Other specified disorders of muscle: Secondary | ICD-10-CM

## 2015-09-16 NOTE — Therapy (Signed)
[place], [name], [address] Phone: [phone]   Fax:  [phone]  Occupational Therapy Treatment  Patient Details  Name: Marc Garcia MRN: [id number] Date of Birth: 1956-03-07 Referring Provider: Sydnee Cabal, MD  Encounter Date: 09/16/2015      OT End of Session - 09/16/15 1151    Visit Number 5   Number of Visits 8   Date for OT Re-Evaluation 11/01/15  Mini reassess: 09/30/15   Authorization Type [employer]   Authorization Time Period 90 combined visits OT/PT/SLP (pt has used 24 this year)   Authorization - Visit Number 29   Authorization - Number of Visits 67   OT Start Time 1101   OT Stop Time 1142   OT Time Calculation (min) 41 min   Activity Tolerance Patient tolerated treatment well   Behavior During Therapy [facility] for tasks assessed/performed      Past Medical History  Diagnosis Date  . Borderline diabetic   . Hypertension   . Hyperlipidemia   . Emphysema of lung ([place])   . GERD (gastroesophageal reflux disease)   . Arthritis     Past Surgical History  Procedure Laterality Date  . Knee surgery Right     times 2  . Cystectomy    . Hernia repair      inguinal bilat   . Colonscopy     . Total knee arthroplasty Right 05/15/2015    Procedure: RIGHT TOTAL KNEE ARTHROPLASTY;  Surgeon: Sydnee Cabal, MD;  Location: [facility];  Service: Orthopedics;  Laterality: Right;    There were no vitals filed for this visit.  Visit Diagnosis:  Stiffness of joint, shoulder region, left  Shoulder weakness  Tight fascia  Pain in joint of left shoulder      Subjective Assessment - 09/16/15 1102    Subjective  S: I can get my shirts on a lot better now.    Currently in Pain? No/denies            [facility] OT Assessment - 09/16/15 1151    Assessment   Diagnosis Left shoulder SAD/DCR/labral debridement   Precautions   Precautions Shoulder   Type of Shoulder Precautions No protocol sent by MD.  Patient was not given any precautions. Start with P/ROM, progress to AA/ROM and A/ROM as patient is able to tolerate.    Shoulder Interventions Shoulder sling/immobilizer;For comfort                  OT Treatments/Exercises (OP) - 09/16/15 1104    Exercises   Exercises Shoulder   Shoulder Exercises: Supine   Protraction PROM;AAROM;10 reps   Horizontal ABduction PROM;AAROM;10 reps   External Rotation PROM;AAROM;10 reps   Internal Rotation PROM;AAROM;10 reps   Flexion PROM;AAROM;10 reps   ABduction PROM;AAROM;10 reps   Shoulder Exercises: Standing   Protraction AAROM;10 reps   Horizontal ABduction AAROM;10 reps   External Rotation AAROM;10 reps   Internal Rotation AAROM;10 reps   Flexion AAROM;10 reps   ABduction AAROM;10 reps   Extension AROM;15 reps   Row AROM;15 reps   Shoulder Elevation AROM;15 reps   Shoulder Exercises: Pulleys   Flexion 2 minutes   ABduction 2 minutes   Shoulder Exercises: ROM/Strengthening   Wall Wash 1'   Thumb Tacks 1'   Prot/Ret//Elev/Dep 1'   Manual Therapy   Manual Therapy Myofascial release   Myofascial Release Myofascial release to left upper arm, trapezius, and scapularis region to decrease fascial restrictions and increase  joint mobility in a pain free zone.                   OT Short Term Goals - 09/04/15 0913    OT SHORT TERM GOAL #1   Title Patient will be educated and independent with HEP to increase functional use of LUE.    Status On-going   OT SHORT TERM GOAL #2   Title Patient will increase P/ROM of LUE to [facility] to increase ability to get shirts on and off.    Status On-going   OT SHORT TERM GOAL #3   Title Patient will decrease pain in LUE to 3/10 or less when completing daily tasks.    Status On-going   OT SHORT TERM GOAL #4   Title Patient will decrease fascial restrictions to mod amount to increase functional mobility of LUE during daily tasks.    Status On-going   OT SHORT TERM GOAL #5   Title Patient  will increase muscle strength in LUE to 3/5 to increase ability to reach above shoulder height.    Status On-going           OT Long Term Goals - 09/04/15 0914    OT LONG TERM GOAL #1   Title Patient will return to highest level of independence using LUE during all daily and leisure tasks.    Status On-going   OT LONG TERM GOAL #2   Title Patient will Increase LUE strength to 4-/5 to increase ability to complete all daily household tasks.   Status On-going   OT LONG TERM GOAL #3   Title Patient will increase A/ROM of LUE to WNL to increase ability to complete all overhead activities.    Status On-going   OT LONG TERM GOAL #4   Title Patient will decrese fascial restrictions to min amount to increase functional mobility of LUE during daily tasks.    Status On-going   OT LONG TERM GOAL #5   Title Patient will decrease pain level in LUE to 1/10 or less to increase ability to complete daily tasks with less difficulty.    Status On-going               Plan - 09/16/15 1152    Clinical Impression Statement A: Pt reports he is feeling great and is using his arm at home for most tasks. Pt demonstrates P/ROM [facility], began AA/ROM exercises this session. Added wall wash and pulleys. Pt required verbal cuing for form during exercises.    Plan P: Continue AA/ROM exercises, add to HEP.         Problem List Patient Active Problem List   Diagnosis Date Noted  . Osteoarthritis of right knee 05/15/2015  . S/P knee replacement 05/15/2015  . Lumbago 05/10/2011    Guadelupe Sabin, OTR/L  [phone]  09/16/2015, 11:53 AM  [place] [address] [place], [name], [address] Phone: [phone]   Fax:  [phone]  Name: Marc Garcia MRN: [id number] Date of Birth: January 02, 1956

## 2015-09-18 ENCOUNTER — Ambulatory Visit (HOSPITAL_COMMUNITY): Payer: Managed Care, Other (non HMO) | Admitting: Occupational Therapy

## 2015-09-18 ENCOUNTER — Encounter (HOSPITAL_COMMUNITY): Payer: Self-pay | Admitting: Occupational Therapy

## 2015-09-18 DIAGNOSIS — Z9889 Other specified postprocedural states: Secondary | ICD-10-CM | POA: Diagnosis not present

## 2015-09-18 DIAGNOSIS — M6289 Other specified disorders of muscle: Secondary | ICD-10-CM

## 2015-09-18 DIAGNOSIS — M25612 Stiffness of left shoulder, not elsewhere classified: Secondary | ICD-10-CM

## 2015-09-18 DIAGNOSIS — M629 Disorder of muscle, unspecified: Secondary | ICD-10-CM

## 2015-09-18 DIAGNOSIS — R29898 Other symptoms and signs involving the musculoskeletal system: Secondary | ICD-10-CM

## 2015-09-18 DIAGNOSIS — M25512 Pain in left shoulder: Secondary | ICD-10-CM

## 2015-09-18 NOTE — Therapy (Signed)
[facility] [address] [place], [place], [address] Phone: [phone]   Fax:  [phone]  Occupational Therapy Treatment  Patient Details  Name: Marc Garcia MRN: [id number] Date of Birth: February 13, 1956 Referring Provider: Eugenia Mcalpine, MD  Encounter Date: 09/18/2015      OT End of Session - 09/18/15 1146    Visit Number 6   Number of Visits 8   Date for OT Re-Evaluation 11/01/15  Mini reassess: 09/30/15   Authorization Type [employer]   Authorization Time Period 90 combined visits OT/PT/SLP (pt has used 24 this year)   Authorization - Visit Number 30   Authorization - Number of Visits 90   OT Start Time 1102   OT Stop Time 1140   OT Time Calculation (min) 38 min   Activity Tolerance Patient tolerated treatment well   Behavior During Therapy [name] for tasks assessed/performed      Past Medical History  Diagnosis Date  . Borderline diabetic   . Hypertension   . Hyperlipidemia   . Emphysema of lung ([facility])   . GERD (gastroesophageal reflux disease)   . Arthritis     Past Surgical History  Procedure Laterality Date  . Knee surgery Right     times 2  . Cystectomy    . Hernia repair      inguinal bilat   . Colonscopy     . Total knee arthroplasty Right 05/15/2015    Procedure: RIGHT TOTAL KNEE ARTHROPLASTY;  Surgeon: Eugenia Mcalpine, MD;  Location: [facility];  Service: Orthopedics;  Laterality: Right;    There were no vitals filed for this visit.  Visit Diagnosis:  Stiffness of joint, shoulder region, left  Shoulder weakness  Tight fascia  Pain in joint of left shoulder      Subjective Assessment - 09/18/15 1103    Subjective  S: I just have pain in that one spot.    Currently in Pain? Yes   Pain Score 1    Pain Location Shoulder   Pain Orientation Left   Pain Descriptors / Indicators Aching   Pain Type Acute pain            [facility] OT Assessment - 09/18/15 1120    Assessment   Diagnosis Left shoulder  SAD/DCR/labral debridement   Precautions   Precautions Shoulder   Type of Shoulder Precautions No protocol sent by MD. Patient was not given any precautions. Start with P/ROM, progress to AA/ROM and A/ROM as patient is able to tolerate.                   OT Treatments/Exercises (OP) - 09/18/15 1104    Exercises   Exercises Shoulder   Shoulder Exercises: Supine   Protraction PROM;5 reps;AAROM;10 reps   Horizontal ABduction PROM;5 reps;AAROM;10 reps   External Rotation PROM;5 reps;AAROM;10 reps   Internal Rotation PROM;5 reps;AAROM;10 reps   Flexion PROM;5 reps;AAROM;10 reps   ABduction PROM;5 reps;AAROM;10 reps   Shoulder Exercises: Standing   Protraction AAROM;10 reps   Horizontal ABduction AAROM;10 reps   External Rotation AAROM;10 reps   Internal Rotation AAROM;10 reps   Flexion AAROM;10 reps   ABduction AAROM;10 reps   Extension Theraband;10 reps   Theraband Level (Shoulder Extension) Level 2 (Red)   Row Theraband;10 reps   Theraband Level (Shoulder Row) Level 2 (Red)   Retraction Theraband;10 reps   Theraband Level (Shoulder Retraction) Level 2 (Red)   Shoulder Exercises: Therapy Ball   Right/Left 10 reps  Shoulder Exercises: ROM/Strengthening   Wall Wash 1'   Proximal Shoulder Strengthening, Supine 10X each no rest break   Proximal Shoulder Strengthening, Seated 10X each no rest break   Prot/Ret//Elev/Dep 1'   Manual Therapy   Manual Therapy Myofascial release   Myofascial Release Myofascial release to left upper arm, trapezius, and scapularis region to decrease fascial restrictions and increase joint mobility in a pain free zone.                 OT Education - 09/18/15 1133    Education provided Yes   Education Details AA/ROM exercises   Person(s) Educated Patient   Methods Explanation;Demonstration;Handout   Comprehension Verbalized understanding;Returned demonstration          OT Short Term Goals - 09/04/15 0913    OT SHORT TERM GOAL  #1   Title Patient will be educated and independent with HEP to increase functional use of LUE.    Status On-going   OT SHORT TERM GOAL #2   Title Patient will increase P/ROM of LUE to [facility] to increase ability to get shirts on and off.    Status On-going   OT SHORT TERM GOAL #3   Title Patient will decrease pain in LUE to 3/10 or less when completing daily tasks.    Status On-going   OT SHORT TERM GOAL #4   Title Patient will decrease fascial restrictions to mod amount to increase functional mobility of LUE during daily tasks.    Status On-going   OT SHORT TERM GOAL #5   Title Patient will increase muscle strength in LUE to 3/5 to increase ability to reach above shoulder height.    Status On-going           OT Long Term Goals - 09/04/15 0914    OT LONG TERM GOAL #1   Title Patient will return to highest level of independence using LUE during all daily and leisure tasks.    Status On-going   OT LONG TERM GOAL #2   Title Patient will Increase LUE strength to 4-/5 to increase ability to complete all daily household tasks.   Status On-going   OT LONG TERM GOAL #3   Title Patient will increase A/ROM of LUE to WNL to increase ability to complete all overhead activities.    Status On-going   OT LONG TERM GOAL #4   Title Patient will decrese fascial restrictions to min amount to increase functional mobility of LUE during daily tasks.    Status On-going   OT LONG TERM GOAL #5   Title Patient will decrease pain level in LUE to 1/10 or less to increase ability to complete daily tasks with less difficulty.    Status On-going               Plan - 09/18/15 1146    Clinical Impression Statement A: Added scapular theraband exercises and therapy ball circles this session. Pt demonstrates range of motion WNL and performs exercises with correct form. No complaints of pain during session.    Plan P: Progress to A/ROM if able to tolerate.        Problem List Patient Active Problem  List   Diagnosis Date Noted  . Osteoarthritis of right knee 05/15/2015  . S/P knee replacement 05/15/2015  . Lumbago 05/10/2011    Marc Garcia, OTR/L  [phone]  09/18/2015, 11:53 AM  [facility] [address] [place], [place], [address] Phone: [phone]  Fax:  [phone]  Name: Marc Garcia MRN: [id number] Date of Birth: [birth year]

## 2015-09-18 NOTE — Patient Instructions (Signed)
Perform each exercise ___10-15_____ reps. 2x days.   Protraction - STANDING  Start by holding a wand or cane at chest height.  Next, slowly push the wand outwards in front of your body so that your elbows become fully straightened. Then, return to the original position.     Shoulder FLEXION - STANDING - PALMS UP  In the standing position, hold a wand/cane with both arms, palms up on both sides. Raise up the wand/cane allowing your unaffected arm to perform most of the effort. Your affected arm should be partially relaxed.      Internal/External ROTATION - STANDING  In the standing position, hold a wand/cane with both hands keeping your elbows bent. Move your arms and wand/cane to one side.  Your affected arm should be partially relaxed while your unaffected arm performs most of the effort.       Shoulder ABDUCTION - STANDING  While holding a wand/cane palm face up on the injured side and palm face down on the uninjured side, slowly raise up your injured arm to the side.                     Horizontal Abduction/Adduction      Straight arms holding cane at shoulder height, bring cane to right, center, left. Repeat starting to left.   Copyright  VHI. All rights reserved.

## 2015-09-21 ENCOUNTER — Ambulatory Visit (HOSPITAL_COMMUNITY): Payer: Managed Care, Other (non HMO) | Admitting: Specialist

## 2015-09-21 ENCOUNTER — Encounter (HOSPITAL_COMMUNITY): Payer: Self-pay | Admitting: Physical Therapy

## 2015-09-21 DIAGNOSIS — M629 Disorder of muscle, unspecified: Secondary | ICD-10-CM

## 2015-09-21 DIAGNOSIS — M25612 Stiffness of left shoulder, not elsewhere classified: Secondary | ICD-10-CM

## 2015-09-21 DIAGNOSIS — R29898 Other symptoms and signs involving the musculoskeletal system: Secondary | ICD-10-CM

## 2015-09-21 DIAGNOSIS — M6289 Other specified disorders of muscle: Secondary | ICD-10-CM

## 2015-09-21 DIAGNOSIS — Z9889 Other specified postprocedural states: Secondary | ICD-10-CM | POA: Diagnosis not present

## 2015-09-21 DIAGNOSIS — M25512 Pain in left shoulder: Secondary | ICD-10-CM

## 2015-09-21 NOTE — Therapy (Signed)
[facility] [address] [place], [place], [address] Phone: [phone]   Fax:  [phone]  Occupational Therapy Treatment  Patient Details  Name: Marc Garcia MRN: [id number] Date of Birth: 06-18-1956 Referring Provider: Eugenia Mcalpine, MD  Encounter Date: 09/21/2015      OT End of Session - 09/21/15 1146    Visit Number 7   Number of Visits 8   Date for OT Re-Evaluation 11/01/15  mini reassess on 09/30/15   Authorization Type [employer]   Authorization Time Period 90 combined visits OT/PT/SLP (pt has used 24 this year)   Authorization - Visit Number 31   Authorization - Number of Visits 90   OT Start Time 1108   OT Stop Time 1144   OT Time Calculation (min) 36 min   Activity Tolerance Patient tolerated treatment well   Behavior During Therapy WFL for tasks assessed/performed      Past Medical History  Diagnosis Date  . Borderline diabetic   . Hypertension   . Hyperlipidemia   . Emphysema of lung (HCC)   . GERD (gastroesophageal reflux disease)   . Arthritis     Past Surgical History  Procedure Laterality Date  . Knee surgery Right     times 2  . Cystectomy    . Hernia repair      inguinal bilat   . Colonscopy     . Total knee arthroplasty Right 05/15/2015    Procedure: RIGHT TOTAL KNEE ARTHROPLASTY;  Surgeon: Eugenia Mcalpine, MD;  Location: [facility];  Service: Orthopedics;  Laterality: Right;    There were no vitals filed for this visit.  Visit Diagnosis:  Stiffness of joint, shoulder region, left  Shoulder weakness  Tight fascia  Pain in joint of left shoulder      Subjective Assessment - 09/21/15 1105    Subjective  S:  I have very little pain.   Currently in Pain? No/denies   Pain Score 0-No pain            [facility] OT Assessment - 09/21/15 0001    Assessment   Diagnosis Left shoulder SAD/DCR/labral debridement   Precautions   Precautions Shoulder   Type of Shoulder Precautions No protocol sent by  MD. Patient was not given any precautions. Start with P/ROM, progress to AA/ROM and A/ROM as patient is able to tolerate.                   OT Treatments/Exercises (OP) - 09/21/15 0001    Exercises   Exercises Shoulder   Shoulder Exercises: Supine   Protraction PROM;5 reps;AROM;10 reps   Horizontal ABduction PROM;5 reps;AROM;10 reps   External Rotation PROM;5 reps;AROM;10 reps   Internal Rotation PROM;5 reps;AROM;10 reps   Flexion PROM;5 reps;AROM;10 reps   ABduction PROM;5 reps;AROM;10 reps   Other Supine Exercises serratus anterior punch 10 times A/ROM   Shoulder Exercises: Standing   Protraction AROM;10 reps   Horizontal ABduction AROM;10 reps   External Rotation AROM;10 reps   Internal Rotation AROM;10 reps   Flexion AROM;10 reps   ABduction AROM;10 reps   Extension Theraband;15 reps   Theraband Level (Shoulder Extension) Level 2 (Red)   Row Theraband;15 reps   Theraband Level (Shoulder Row) Level 2 (Red)   Retraction Theraband;15 reps   Theraband Level (Shoulder Retraction) Level 2 (Red)   Shoulder Exercises: ROM/Strengthening   Wall Wash 1'   Thumb Tacks 1'   Over Head Lace 2'   Prot/Ret//Elev/Dep 1'  Manual Therapy   Manual Therapy Myofascial release   Manual therapy comments Manual therapy completed prior to other treatment during this session.   Myofascial Release Myofascial release to left upper arm, trapezius, and scapularis region to decrease fascial restrictions and increase joint mobility in a pain free zone.                   OT Short Term Goals - 09/04/15 0913    OT SHORT TERM GOAL #1   Title Patient will be educated and independent with HEP to increase functional use of LUE.    Status On-going   OT SHORT TERM GOAL #2   Title Patient will increase P/ROM of LUE to [facility] to increase ability to get shirts on and off.    Status On-going   OT SHORT TERM GOAL #3   Title Patient will decrease pain in LUE to 3/10 or less when completing daily  tasks.    Status On-going   OT SHORT TERM GOAL #4   Title Patient will decrease fascial restrictions to mod amount to increase functional mobility of LUE during daily tasks.    Status On-going   OT SHORT TERM GOAL #5   Title Patient will increase muscle strength in LUE to 3/5 to increase ability to reach above shoulder height.    Status On-going           OT Long Term Goals - 09/04/15 0914    OT LONG TERM GOAL #1   Title Patient will return to highest level of independence using LUE during all daily and leisure tasks.    Status On-going   OT LONG TERM GOAL #2   Title Patient will Increase LUE strength to 4-/5 to increase ability to complete all daily household tasks.   Status On-going   OT LONG TERM GOAL #3   Title Patient will increase A/ROM of LUE to WNL to increase ability to complete all overhead activities.    Status On-going   OT LONG TERM GOAL #4   Title Patient will decrese fascial restrictions to min amount to increase functional mobility of LUE during daily tasks.    Status On-going   OT LONG TERM GOAL #5   Title Patient will decrease pain level in LUE to 1/10 or less to increase ability to complete daily tasks with less difficulty.    Status On-going               Plan - 09/21/15 1147    Clinical Impression Statement A:  transitioned to A/ROM in supine and standing this date with min vg for technique.  Patient fatigued with overhead lace activity.   Plan P:  increase repetitions with A/ROM, add x to v and w arms for scapular stability needed for greater overhead sustained activiity tolerance.        Problem List Patient Active Problem List   Diagnosis Date Noted  . Osteoarthritis of right knee 05/15/2015  . S/P knee replacement 05/15/2015  . Lumbago 05/10/2011    Vangie Bicker, OTR/L [phone]  09/21/2015, 11:49 AM  [facility] [address] [place], [name], [address] Phone: [phone]   Fax:   [phone]  Name: Marc Garcia MRN: [id number] Date of Birth: May 30, 1956

## 2015-09-21 NOTE — Therapy (Signed)
[facility] [place], [name], [address] Phone: [phone]   Fax:  [phone]  Patient Details  Name: Marc Garcia MRN: [id number] Date of Birth: [birth year] Referring Provider:  No ref. provider found  Encounter Date: 09/21/2015  PHYSICAL THERAPY DISCHARGE SUMMARY  Visits from Start of Care: 13  Current functional level related to goals / functional outcomes: Pt did not return to PT for treatment for TKA, unable to assess current functional level.    Remaining deficits: Unable to assess   Education / Equipment: N/A  Plan:                                                    Patient goals were partially met. Patient is being discharged due to not returning since the last visit.  ?????      Hilma Favors, PT, DPT [phone] 09/21/2015, 2:23 PM  [place], [name], [address] Phone: [phone]   Fax:  [phone]

## 2015-09-23 ENCOUNTER — Encounter (HOSPITAL_COMMUNITY): Payer: Self-pay | Admitting: Occupational Therapy

## 2015-09-23 ENCOUNTER — Ambulatory Visit (HOSPITAL_COMMUNITY): Payer: Managed Care, Other (non HMO) | Admitting: Occupational Therapy

## 2015-09-23 DIAGNOSIS — M629 Disorder of muscle, unspecified: Secondary | ICD-10-CM

## 2015-09-23 DIAGNOSIS — M25612 Stiffness of left shoulder, not elsewhere classified: Secondary | ICD-10-CM

## 2015-09-23 DIAGNOSIS — R29898 Other symptoms and signs involving the musculoskeletal system: Secondary | ICD-10-CM

## 2015-09-23 DIAGNOSIS — M25512 Pain in left shoulder: Secondary | ICD-10-CM

## 2015-09-23 DIAGNOSIS — M6289 Other specified disorders of muscle: Secondary | ICD-10-CM

## 2015-09-23 DIAGNOSIS — Z9889 Other specified postprocedural states: Secondary | ICD-10-CM | POA: Diagnosis not present

## 2015-09-23 NOTE — Therapy (Signed)
[place], [name], [address] Phone: [phone]   Fax:  [phone]  Occupational Therapy Treatment  Patient Details  Name: Marc Garcia MRN: [id number] Date of Birth: 25-Aug-1956 Referring Provider: Sydnee Cabal, MD  Encounter Date: 09/23/2015      OT End of Session - 09/23/15 1056    Visit Number 8   Number of Visits 9   Date for OT Re-Evaluation 11/01/15  mini reassess on 09/30/15   Authorization Type [employer]   Authorization Time Period 90 combined visits OT/PT/SLP (pt has used 24 this year)   Authorization - Visit Number 72   Authorization - Number of Visits 51   OT Start Time 1020   OT Stop Time 1100   OT Time Calculation (min) 40 min   Activity Tolerance Patient tolerated treatment well   Behavior During Therapy [facility] for tasks assessed/performed      Past Medical History  Diagnosis Date  . Borderline diabetic   . Hypertension   . Hyperlipidemia   . Emphysema of lung ([place])   . GERD (gastroesophageal reflux disease)   . Arthritis     Past Surgical History  Procedure Laterality Date  . Knee surgery Right     times 2  . Cystectomy    . Hernia repair      inguinal bilat   . Colonscopy     . Total knee arthroplasty Right 05/15/2015    Procedure: RIGHT TOTAL KNEE ARTHROPLASTY;  Surgeon: Sydnee Cabal, MD;  Location: [facility];  Service: Orthopedics;  Laterality: Right;    There were no vitals filed for this visit.  Visit Diagnosis:  Stiffness of joint, shoulder region, left  Shoulder weakness  Tight fascia  Pain in joint of left shoulder      Subjective Assessment - 09/23/15 1021    Subjective  S: I'm feeling good, only a little pain when I rotate my shoulder.    Currently in Pain? No/denies            [facility] OT Assessment - 09/23/15 1056    Assessment   Diagnosis Left shoulder SAD/DCR/labral debridement   Precautions   Precautions Shoulder   Type of Shoulder Precautions No  protocol sent by MD. Patient was not given any precautions. Start with P/ROM, progress to AA/ROM and A/ROM as patient is able to tolerate.                   OT Treatments/Exercises (OP) - 09/23/15 1022    Exercises   Exercises Shoulder   Shoulder Exercises: Supine   Protraction PROM;5 reps;AROM;12 reps   Horizontal ABduction PROM;5 reps;AROM;12 reps   External Rotation PROM;5 reps;AROM;12 reps   Internal Rotation PROM;5 reps;AROM;12 reps   Flexion PROM;5 reps;AROM;12 reps   ABduction PROM;5 reps;AROM;12 reps   Other Supine Exercises serratus anterior punch 12 times A/ROM   Shoulder Exercises: Standing   Protraction AROM;12 reps   Horizontal ABduction AROM;12 reps   External Rotation AROM;12 reps   Internal Rotation AROM;12 reps   Flexion AROM;12 reps   ABduction AROM;12 reps   Extension Theraband;15 reps   Theraband Level (Shoulder Extension) Level 2 (Red)   Row Theraband;15 reps   Theraband Level (Shoulder Row) Level 2 (Red)   Retraction Theraband;15 reps   Theraband Level (Shoulder Retraction) Level 2 (Red)   Shoulder Exercises: ROM/Strengthening   UBE (Upper Arm Bike) Level 1 3' reverse  cuing to remain between 2.5-3.5   "W" Arms  10X   X to V Arms 10X   Proximal Shoulder Strengthening, Supine 10X each no rest break   Proximal Shoulder Strengthening, Seated 10X each no rest break   Manual Therapy   Manual Therapy Myofascial release   Manual therapy comments Manual therapy completed prior to other treatment during this session.   Myofascial Release Myofascial release to left upper arm, trapezius, and scapularis region to decrease fascial restrictions and increase joint mobility in a pain free zone.                 OT Education - 09/23/15 1045    Education provided Yes   Education Details scapular theraband, A/ROM exercises   Person(s) Educated Patient   Methods Explanation;Demonstration;Handout   Comprehension Verbalized understanding;Returned  demonstration          OT Short Term Goals - 09/04/15 0913    OT SHORT TERM GOAL #1   Title Patient will be educated and independent with HEP to increase functional use of LUE.    Status On-going   OT SHORT TERM GOAL #2   Title Patient will increase P/ROM of LUE to [facility] to increase ability to get shirts on and off.    Status On-going   OT SHORT TERM GOAL #3   Title Patient will decrease pain in LUE to 3/10 or less when completing daily tasks.    Status On-going   OT SHORT TERM GOAL #4   Title Patient will decrease fascial restrictions to mod amount to increase functional mobility of LUE during daily tasks.    Status On-going   OT SHORT TERM GOAL #5   Title Patient will increase muscle strength in LUE to 3/5 to increase ability to reach above shoulder height.    Status On-going           OT Long Term Goals - 09/04/15 0914    OT LONG TERM GOAL #1   Title Patient will return to highest level of independence using LUE during all daily and leisure tasks.    Status On-going   OT LONG TERM GOAL #2   Title Patient will Increase LUE strength to 4-/5 to increase ability to complete all daily household tasks.   Status On-going   OT LONG TERM GOAL #3   Title Patient will increase A/ROM of LUE to WNL to increase ability to complete all overhead activities.    Status On-going   OT LONG TERM GOAL #4   Title Patient will decrese fascial restrictions to min amount to increase functional mobility of LUE during daily tasks.    Status On-going   OT LONG TERM GOAL #5   Title Patient will decrease pain level in LUE to 1/10 or less to increase ability to complete daily tasks with less difficulty.    Status On-going               Plan - 09/23/15 1057    Clinical Impression Statement A: Increased A/ROM to 12 in supine and standing, added x to v arms and w arms for increased scapular stability, added UBE in reverse. Pt performs exercises with good form, intermittant verbal cuing required  for technique.    Plan P: Mini reassessment. Follow up on updated HEP. Complete 15 repetitions of each A/ROM exercise.         Problem List Patient Active Problem List   Diagnosis Date Noted  . Osteoarthritis of right knee 05/15/2015  . S/P knee replacement 05/15/2015  . Lumbago 05/10/2011  Guadelupe Sabin, OTR/L  [phone]  09/23/2015, 11:58 AM  [facility] [address] [place], [name], [address] Phone: [phone]   Fax:  [phone]  Name: Marc Garcia MRN: [id number] Date of Birth: 26-Mar-1956

## 2015-09-23 NOTE — Patient Instructions (Addendum)
(  Home) Extension: Isometric / Bilateral Arm Retraction - Sitting   Facing anchor, hold hands and elbow at shoulder height, with elbow bent.  Pull arms back to squeeze shoulder blades together. Repeat 10-15 times.  Copyright  VHI. All rights reserved.   (Home) Retraction: Row - Bilateral (Anchor)   Facing anchor, arms reaching forward, pull hands toward stomach, keeping elbows bent and at your sides and pinching shoulder blades together. Repeat 10-15 times.  Copyright  VHI. All rights reserved.   (Clinic) Extension / Flexion (Assist)   Face anchor, pull arms back, keeping elbow straight, and squeze shoulder blades together. Repeat 10-15 times.   Copyright  VHI. All rights reserved.    1) Shoulder Protraction    Begin with elbows by your side, slowly "punch" straight out in front of you keeping arms/elbows straight. Repeat _10-15_times, __2__set/day.     2) Shoulder Flexion  Supine:     Standing:         Begin with arms at your side with thumbs pointed up, slowly raise both arms up and forward towards overhead. Repeat_10-15__times, 2___set/day.               3) Horizontal abduction/adduction  Supine:   Standing:           Begin with arms straight out in front of you, bring out to the side in at "T" shape. Keep arms straight entire time. Repeat _10-15___times, _2___sets/day.                 4) Internal & External Rotation    *No band* -Stand with elbows at the side and elbows bent 90 degrees. Move your forearms away from your body, then bring back inward toward the body.  Repeat _10-15__times, __2_sets/day    5) Shoulder Abduction  Supine:     Standing:       Lying on your back begin with your arms flat on the table next to your side. Slowly move your arms out to the side so that they go overhead, in a jumping jack or snow angel movement. Repeat _10-15__times, _2__sets/day

## 2015-09-29 ENCOUNTER — Ambulatory Visit (HOSPITAL_COMMUNITY): Payer: Managed Care, Other (non HMO)

## 2015-10-01 ENCOUNTER — Encounter (HOSPITAL_COMMUNITY): Payer: Managed Care, Other (non HMO)

## 2015-10-06 ENCOUNTER — Encounter (HOSPITAL_COMMUNITY): Payer: Managed Care, Other (non HMO)

## 2015-10-08 ENCOUNTER — Encounter (HOSPITAL_COMMUNITY): Payer: Managed Care, Other (non HMO)

## 2015-10-12 ENCOUNTER — Encounter (HOSPITAL_COMMUNITY): Payer: Self-pay

## 2015-10-12 NOTE — Therapy (Signed)
[place], [name], [address] Phone: [phone]   Fax:  [phone]  Patient Details  Name: Marc Garcia MRN: [id number] Date of Birth: 03/29/1956 Referring Provider:  No ref. provider found  Encounter Date: 10/12/2015 OCCUPATIONAL THERAPY DISCHARGE SUMMARY  Visits from Start of Care: 8  Current functional level related to goals / functional outcomes: OT LONG TERM GOAL #1    Title Patient will return to highest level of independence using LUE during all daily and leisure tasks.    Status On-going   OT LONG TERM GOAL #2   Title Patient will Increase LUE strength to 4-/5 to increase ability to complete all daily household tasks.   Status On-going   OT LONG TERM GOAL #3   Title Patient will increase A/ROM of LUE to WNL to increase ability to complete all overhead activities.    Status On-going   OT LONG TERM GOAL #4   Title Patient will decrese fascial restrictions to min amount to increase functional mobility of LUE during daily tasks.    Status On-going   OT LONG TERM GOAL #5   Title Patient will decrease pain level in LUE to 1/10 or less to increase ability to complete daily tasks with less difficulty.    Status On-going            Remaining deficits: Patient called to request discharge stating that MD released him from therapy. Unknown if goals were met.    Education / Equipment: Strengthening exercises   Plan: Patient agrees to discharge.  Patient goals were not met. Patient is being discharged due to the physician's request.  ?????       Ailene Ravel, OTR/L,CBIS  [phone]  10/12/2015, 4:07 PM  [place] [address] [place], [name], [address] Phone: [phone]   Fax:  [phone]

## 2016-10-28 ENCOUNTER — Other Ambulatory Visit (HOSPITAL_COMMUNITY)
Admission: RE | Admit: 2016-10-28 | Discharge: 2016-10-28 | Disposition: A | Discharge status: Home / Self Care | Payer: Managed Care, Other (non HMO) | Source: Ambulatory Visit | Attending: Internal Medicine | Admitting: Internal Medicine

## 2016-10-28 DIAGNOSIS — E559 Vitamin D deficiency, unspecified: Secondary | ICD-10-CM | POA: Insufficient documentation

## 2016-10-29 LAB — VITAMIN D 25 HYDROXY (VIT D DEFICIENCY, FRACTURES): Vit D, 25-Hydroxy: 13.4 ng/mL — ABNORMAL LOW (ref 30.0–100.0)

## 2017-02-23 ENCOUNTER — Emergency Department (HOSPITAL_COMMUNITY): Payer: BLUE CROSS/BLUE SHIELD

## 2017-02-23 ENCOUNTER — Encounter (HOSPITAL_COMMUNITY): Payer: Self-pay | Admitting: Emergency Medicine

## 2017-02-23 ENCOUNTER — Emergency Department (HOSPITAL_COMMUNITY)
Admission: EM | Admit: 2017-02-23 | Discharge: 2017-02-23 | Disposition: A | Discharge status: Home / Self Care | Payer: BLUE CROSS/BLUE SHIELD | Attending: Emergency Medicine | Admitting: Emergency Medicine

## 2017-02-23 DIAGNOSIS — Z7982 Long term (current) use of aspirin: Secondary | ICD-10-CM | POA: Diagnosis not present

## 2017-02-23 DIAGNOSIS — Z87891 Personal history of nicotine dependence: Secondary | ICD-10-CM | POA: Diagnosis not present

## 2017-02-23 DIAGNOSIS — Z79899 Other long term (current) drug therapy: Secondary | ICD-10-CM | POA: Diagnosis not present

## 2017-02-23 DIAGNOSIS — R002 Palpitations: Secondary | ICD-10-CM | POA: Diagnosis present

## 2017-02-23 DIAGNOSIS — I10 Essential (primary) hypertension: Secondary | ICD-10-CM | POA: Insufficient documentation

## 2017-02-23 DIAGNOSIS — I493 Ventricular premature depolarization: Secondary | ICD-10-CM | POA: Insufficient documentation

## 2017-02-23 LAB — TROPONIN I: Troponin I: <0.03 ng/mL (ref <0.03)

## 2017-02-23 LAB — BASIC METABOLIC PANEL
ANION GAP: 7 (ref 5–15)
BUN: 10 mg/dL (ref 6–20)
CHLORIDE: 102 mmol/L (ref 101–111)
CO2: 30 mmol/L (ref 22–32)
Calcium: 9.5 mg/dL (ref 8.9–10.3)
Creatinine, Ser: 1 mg/dL (ref 0.61–1.24)
GFR calc non Af Amer: >60 mL/min (ref >60)
Glucose, Bld: 102 mg/dL — ABNORMAL HIGH (ref 65–99)
Potassium: 4.3 mmol/L (ref 3.5–5.1)
Sodium: 139 mmol/L (ref 135–145)

## 2017-02-23 LAB — CBC
HCT: 46.9 % (ref 39.0–52.0)
HEMOGLOBIN: 15.5 g/dL (ref 13.0–17.0)
MCH: 28.7 pg (ref 26.0–34.0)
MCHC: 33 g/dL (ref 30.0–36.0)
MCV: 86.7 fL (ref 78.0–100.0)
Platelets: 178 10*3/uL (ref 150–400)
RBC: 5.41 MIL/uL (ref 4.22–5.81)
RDW: 13.4 % (ref 11.5–15.5)
WBC: 8.3 10*3/uL (ref 4.0–10.5)

## 2017-02-23 NOTE — ED Triage Notes (Signed)
Pt reports having palpitations for the past few days.  States it feels like his heart beats hard and fast for a few seconds and then resolves.

## 2017-02-23 NOTE — ED Provider Notes (Signed)
[facility] DEPT Provider Note   CSN: [id number] Arrival date & time: 02/23/17  1536     History   Chief Complaint Chief Complaint  Patient presents with  . Palpitations    HPI Marc Garcia is a 61 y.o. male.  Patient had a sensation of palpitations for the past 2-3 days. They're not associated with any activity. They disappear spontaneously. Episodes have been erratic. One episode of subtle dyspnea associated with palpitation. No substernal chest pain. No history of coronary artery disease. Patient is asymptomatic at the present time. No smoking. No drinking alcohol. No excessive caffeinated beverages      Past Medical History:  Diagnosis Date  . Arthritis   . Borderline diabetic   . Emphysema of lung ([place])   . GERD (gastroesophageal reflux disease)   . Hyperlipidemia   . Hypertension     Patient Active Problem List   Diagnosis Date Noted  . Osteoarthritis of right knee 05/15/2015  . S/P knee replacement 05/15/2015  . Lumbago 05/10/2011    Past Surgical History:  Procedure Laterality Date  . colonscopy     . CYSTECTOMY    . HERNIA REPAIR     inguinal bilat   . KNEE SURGERY Right    times 2  . TOTAL KNEE ARTHROPLASTY Right 05/15/2015   Procedure: RIGHT TOTAL KNEE ARTHROPLASTY;  Surgeon: Sydnee Cabal, MD;  Location: [facility];  Service: Orthopedics;  Laterality: Right;       Home Medications    Prior to Admission medications   Medication Sig Start Date End Date Taking? Authorizing Provider  aspirin EC 325 MG tablet Take 1 tablet (325 mg total) by mouth 2 (two) times daily. 05/15/15  Yes Bryson L Stilwell, PA-C  atorvastatin (LIPITOR) 10 MG tablet Take 10 mg by mouth every morning.    Yes Historical Provider, MD  HYDROcodone-acetaminophen (NORCO) 7.5-325 MG tablet Take 1 tablet by mouth every 6 (six) hours as needed.   Yes Historical Provider, MD  olmesartan (BENICAR) 20 MG tablet Take 1 tablet by mouth daily. 01/16/17  Yes Historical Provider, MD    pantoprazole (PROTONIX) 40 MG tablet Take 40 mg by mouth every morning.    Yes Historical Provider, MD  Sennosides 25 MG TABS Take 1 tablet by mouth daily.   Yes Historical Provider, MD  meloxicam (MOBIC) 15 MG tablet Take 15 mg by mouth every morning.    Historical Provider, MD  methocarbamol (ROBAXIN) 500 MG tablet Take 1 tablet (500 mg total) by mouth every 6 (six) hours as needed for muscle spasms. Patient not taking: Reported on 02/23/2017 05/15/15   Sueanne Margarita Stilwell, PA-C  oxyCODONE-acetaminophen (ROXICET) 5-325 MG per tablet Take 1-2 tablets by mouth every 4 (four) hours as needed for severe pain. Patient not taking: Reported on 02/23/2017 05/15/15   Bryson L Stilwell, PA-C  tiZANidine (ZANAFLEX) 4 MG tablet Take 1 tablet by mouth 3 (three) times daily. 01/30/17   Historical Provider, MD    Family History History reviewed. No pertinent family history.  Social History Social History  Substance Use Topics  . Smoking status: Former Smoker    Packs/day: 1.00    Years: 40.00    Types: Cigarettes    Quit date: 05/14/2012  . Smokeless tobacco: Never Used  . Alcohol use No     Allergies   Patient has no known allergies.   Review of Systems Review of Systems  All other systems reviewed and are negative.    Physical Exam Updated Vital  Signs BP 126/85   Pulse 69   Temp 98 F (36.7 C) (Oral)   Resp (!) 9   Ht 6' (1.829 m)   Wt 183 lb (83 kg)   SpO2 97%   BMI 24.82 kg/m   Physical Exam  Constitutional: He is oriented to person, place, and time. He appears well-developed and well-nourished.  HENT:  Head: Normocephalic and atraumatic.  Eyes: Conjunctivae are normal.  Neck: Neck supple.  Cardiovascular: Normal rate and regular rhythm.   Pulmonary/Chest: Effort normal and breath sounds normal.  Abdominal: Soft. Bowel sounds are normal.  Musculoskeletal: Normal range of motion.  Neurological: He is alert and oriented to person, place, and time.  Skin: Skin is warm and  dry.  Psychiatric: He has a normal mood and affect. His behavior is normal.  Nursing note and vitals reviewed.    ED Treatments / Results  Labs (all labs ordered are listed, but only abnormal results are displayed) Labs Reviewed  BASIC METABOLIC PANEL - Abnormal; Notable for the following:       Result Value   Glucose, Bld 102 (*)    All other components within normal limits  CBC  TROPONIN I    EKG  EKG Interpretation  Date/Time:  Thursday February 23 2017 15:51:45 EDT Ventricular Rate:  77 PR Interval:  166 QRS Duration: 84 QT Interval:  372 QTC Calculation: 420 R Axis:   26 Text Interpretation:  Normal sinus rhythm Normal ECG Confirmed by Lacinda Axon  MD, Kimmie Berggren ([id number]) on 02/23/2017 7:14:31 PM       Radiology Dg Chest 2 View  Result Date: 02/23/2017 CLINICAL DATA:  Cardiac palpitations EXAM: CHEST  2 VIEW COMPARISON:  May 17, 2014 chest radiograph and chest CT FINDINGS: There is no edema or consolidation. The heart size and pulmonary vascularity are normal. No adenopathy. There is degenerative change in each shoulder. IMPRESSION: No edema or consolidation. Electronically Signed   By: Lowella Grip III M.D.   On: 02/23/2017 16:28    Procedures Procedures (including critical care time)  Medications Ordered in ED Medications - No data to display   Initial Impression / Assessment and Plan / ED Course  I have reviewed the triage vital signs and the nursing notes.  Pertinent labs & imaging results that were available during my care of the patient were reviewed by me and considered in my medical decision making (see chart for details).     Patient has normal exam. I did note one PVC and a rhythm strip. Findings were discussed with the patient is wife. He'll get primary care and/or cardiology follow-up.  Final Clinical Impressions(s) / ED Diagnoses   Final diagnoses:  Premature ventricular contraction    New Prescriptions New Prescriptions   No medications on file      Nat Christen, MD 02/23/17 2010

## 2017-02-23 NOTE — Discharge Instructions (Signed)
Blood work, EKG, chest x-ray were all good. Follow-up your primary care doctor or cardiologist. Phone number for cardiology given.

## 2017-03-01 ENCOUNTER — Ambulatory Visit (INDEPENDENT_AMBULATORY_CARE_PROVIDER_SITE_OTHER): Payer: BLUE CROSS/BLUE SHIELD | Admitting: Cardiology

## 2017-03-01 ENCOUNTER — Encounter: Payer: Self-pay | Admitting: Cardiology

## 2017-03-01 VITALS — BP 120/84 | HR 71 | Ht 72.0 in | Wt 179.0 lb

## 2017-03-01 DIAGNOSIS — R002 Palpitations: Secondary | ICD-10-CM

## 2017-03-01 NOTE — Progress Notes (Signed)
Clinical Summary Marc Garcia is a 61 y.o.male seen for follow up of the following medical problems.   1. Palpitations - seen in ER 02/23/17 with symptoms - negative workup in ER, isolated PVC noted on telemetry  - reports palpitations on and off for 1-2 years. Usuaully just sporadic. Last week episode 2 days of higher frequency, longer duration. Feels like fluttering, can have some dizziness. Can occur at rest or with activity - no EtoH. No coffee, occasional tea, sun drop x 16 oz daily, no energy drinks    SH: retired TEFL teacher.  Past Medical History:  Diagnosis Date  . Arthritis   . Borderline diabetic   . Emphysema of lung ([place])   . GERD (gastroesophageal reflux disease)   . Hyperlipidemia   . Hypertension      No Known Allergies   Current Outpatient Prescriptions  Medication Sig Dispense Refill  . aspirin EC 325 MG tablet Take 1 tablet (325 mg total) by mouth 2 (two) times daily. 60 tablet 0  . atorvastatin (LIPITOR) 10 MG tablet Take 10 mg by mouth every morning.     Marland Kitchen HYDROcodone-acetaminophen (NORCO) 7.5-325 MG tablet Take 1 tablet by mouth every 6 (six) hours as needed.    . meloxicam (MOBIC) 15 MG tablet Take 15 mg by mouth every morning.    . methocarbamol (ROBAXIN) 500 MG tablet Take 1 tablet (500 mg total) by mouth every 6 (six) hours as needed for muscle spasms. (Patient not taking: Reported on 02/23/2017) 50 tablet 2  . olmesartan (BENICAR) 20 MG tablet Take 1 tablet by mouth daily.  10  . oxyCODONE-acetaminophen (ROXICET) 5-325 MG per tablet Take 1-2 tablets by mouth every 4 (four) hours as needed for severe pain. (Patient not taking: Reported on 02/23/2017) 60 tablet 0  . pantoprazole (PROTONIX) 40 MG tablet Take 40 mg by mouth every morning.     . Sennosides 25 MG TABS Take 1 tablet by mouth daily.    Marland Kitchen tiZANidine (ZANAFLEX) 4 MG tablet Take 1 tablet by mouth 3 (three) times daily.  0   No current facility-administered medications for this  visit.      Past Surgical History:  Procedure Laterality Date  . colonscopy     . CYSTECTOMY    . HERNIA REPAIR     inguinal bilat   . KNEE SURGERY Right    times 2  . TOTAL KNEE ARTHROPLASTY Right 05/15/2015   Procedure: RIGHT TOTAL KNEE ARTHROPLASTY;  Surgeon: Sydnee Cabal, MD;  Location: [facility];  Service: Orthopedics;  Laterality: Right;     No Known Allergies    No family history on file.   Social History Marc Garcia reports that he quit smoking about 4 years ago. His smoking use included Cigarettes. He has a 40.00 pack-year smoking history. He has never used smokeless tobacco. Marc Garcia reports that he does not drink alcohol.   Review of Systems CONSTITUTIONAL: No weight loss, fever, chills, weakness or fatigue.  HEENT: Eyes: No visual loss, blurred vision, double vision or yellow sclerae.No hearing loss, sneezing, congestion, runny nose or sore throat.  SKIN: No rash or itching.  CARDIOVASCULAR: per HPI RESPIRATORY: No shortness of breath, cough or sputum.  GASTROINTESTINAL: No anorexia, nausea, vomiting or diarrhea. No abdominal pain or blood.  GENITOURINARY: No burning on urination, no polyuria NEUROLOGICAL: No headache, dizziness, syncope, paralysis, ataxia, numbness or tingling in the extremities. No change in bowel or bladder control.  MUSCULOSKELETAL: No muscle, back  pain, joint pain or stiffness.  LYMPHATICS: No enlarged nodes. No history of splenectomy.  PSYCHIATRIC: No history of depression or anxiety.  ENDOCRINOLOGIC: No reports of sweating, cold or heat intolerance. No polyuria or polydipsia.  Marland Kitchen   Physical Examination Vitals:   03/01/17 1300 03/01/17 1304  BP: 130/82 120/84  Pulse: 71    Vitals:   03/01/17 1300  Weight: 179 lb (81.2 kg)  Height: 6' (1.829 m)    Gen: resting comfortably, no acute distress HEENT: no scleral icterus, pupils equal round and reactive, no palptable cervical adenopathy,  CV: RRR, no mr/g, no jvd Resp: Clear to  auscultation bilaterally GI: abdomen is soft, non-tender, non-distended, normal bowel sounds, no hepatosplenomegaly MSK: extremities are warm, no edema.  Skin: warm, no rash Neuro:  no focal deficits Psych: appropriate affect    Assessment and Plan  1. Palpitations - EKG in clinic shows NSR - obtain 14 day event monitor - request pcp labs, if not TSH will need to add next visit.       Arnoldo Lenis, M.D.

## 2017-03-01 NOTE — Patient Instructions (Signed)
Medication Instructions:  Your physician recommends that you continue on your current medications as directed. Please refer to the Current Medication list given to you today.   Labwork: I WILL REQUEST A COPY OF LABS FROM PCP  Testing/Procedures: Your physician has recommended that you wear an event monitor. Event monitors are medical devices that record the heart's electrical activity. Doctors most often [place] these monitors to diagnose arrhythmias. Arrhythmias are problems with the speed or rhythm of the heartbeat. The monitor is a small, portable device. You can wear one while you do your normal daily activities. This is usually used to diagnose what is causing palpitations/syncope (passing out).    Follow-Up: Your physician recommends that you schedule a follow-up appointment in: 1 MONTH  Any Other Special Instructions Will Be Listed Below (If Applicable).     If you need a refill on your cardiac medications before your next appointment, please call your pharmacy.

## 2017-03-06 ENCOUNTER — Ambulatory Visit (INDEPENDENT_AMBULATORY_CARE_PROVIDER_SITE_OTHER): Payer: BLUE CROSS/BLUE SHIELD

## 2017-03-06 DIAGNOSIS — R002 Palpitations: Secondary | ICD-10-CM | POA: Diagnosis not present

## 2017-04-05 ENCOUNTER — Telehealth: Payer: Self-pay

## 2017-04-05 NOTE — Telephone Encounter (Signed)
Called pt. No answer, left message for pt to return call.  

## 2017-04-07 ENCOUNTER — Telehealth: Payer: Self-pay | Admitting: *Deleted

## 2017-04-07 NOTE — Telephone Encounter (Signed)
Called patient with test results. No answer. Left message to call back.  

## 2017-04-07 NOTE — Telephone Encounter (Signed)
-----   Message from Acquanetta Chain, LPN sent at 11/05/6151  1:54 PM EDT -----   ----- Message ----- From: Arnoldo Lenis, MD Sent: 04/03/2017   1:46 PM To: Acquanetta Chain, LPN  Heart monitor overall looks good, just some occasional extrea heart beats, No significant abnormal rhythms. We will discuss further at our upcoming f/u   Zandra Abts MD

## 2017-04-10 ENCOUNTER — Ambulatory Visit (INDEPENDENT_AMBULATORY_CARE_PROVIDER_SITE_OTHER): Payer: BLUE CROSS/BLUE SHIELD | Admitting: Cardiology

## 2017-04-10 ENCOUNTER — Encounter: Payer: Self-pay | Admitting: Cardiology

## 2017-04-10 VITALS — BP 118/70 | HR 76 | Ht 72.0 in | Wt 176.0 lb

## 2017-04-10 DIAGNOSIS — R002 Palpitations: Secondary | ICD-10-CM | POA: Diagnosis not present

## 2017-04-10 NOTE — Progress Notes (Signed)
Clinical Summary Marc Garcia is a 61 y.o.male seen for follow up of the following medical problems.   1. Palpitations - seen in ER 02/23/17 with symptoms - negative workup in ER, isolated PVC noted on telemetry  - reports palpitations on and off for 1-2 years. Usuaully just sporadic. Last week episode 2 days of higher frequency, longer duration. Feels like fluttering, can have some dizziness. Can occur at rest or with activity - no EtoH. No coffee, occasional tea, sun drop x 16 oz daily, no energy drinks  02/2017 heart monitor with occasional PVCs, no significant arrhythnmias - still with occasional palpitations at times, though overall not very bothersome.    2. Fatigue - episode occurred about 3 weeks - outside working in garden 10-15 minutes.  - felt lightheaded,felt weak all over. Mild SOB. No chest pain, no palpitations - went inside and layed down, after 30 minutes felt better - skipped meal that day  SH: retired TEFL teacher.    Past Medical History:  Diagnosis Date  . Arthritis   . Borderline diabetic   . Emphysema of lung ([place])   . GERD (gastroesophageal reflux disease)   . Hyperlipidemia   . Hypertension      No Known Allergies   Current Outpatient Prescriptions  Medication Sig Dispense Refill  . aspirin EC 325 MG tablet Take 1 tablet (325 mg total) by mouth 2 (two) times daily. 60 tablet 0  . atorvastatin (LIPITOR) 10 MG tablet Take 10 mg by mouth every morning.     Marland Kitchen HYDROcodone-acetaminophen (NORCO) 7.5-325 MG tablet Take 1 tablet by mouth every 6 (six) hours as needed.    Marland Kitchen olmesartan (BENICAR) 20 MG tablet Take 1 tablet by mouth daily.  10  . pantoprazole (PROTONIX) 40 MG tablet Take 40 mg by mouth every morning.     . Sennosides 25 MG TABS Take 1 tablet by mouth daily.     No current facility-administered medications for this visit.      Past Surgical History:  Procedure Laterality Date  . colonscopy     . CYSTECTOMY    .  HERNIA REPAIR     inguinal bilat   . KNEE SURGERY Right    times 2  . TOTAL KNEE ARTHROPLASTY Right 05/15/2015   Procedure: RIGHT TOTAL KNEE ARTHROPLASTY;  Surgeon: Sydnee Cabal, MD;  Location: [facility];  Service: Orthopedics;  Laterality: Right;     No Known Allergies    Family History  Problem Relation Age of Onset  . Stroke Mother   . Cancer Father      Social History Marc Garcia reports that he quit smoking about 4 years ago. His smoking use included Cigarettes. He has a 40.00 pack-year smoking history. He has never used smokeless tobacco. Marc Garcia reports that he does not drink alcohol.   Review of Systems CONSTITUTIONAL: episode of fatigue HEENT: Eyes: No visual loss, blurred vision, double vision or yellow sclerae.No hearing loss, sneezing, congestion, runny nose or sore throat.  SKIN: No rash or itching.  CARDIOVASCULAR: per hpi RESPIRATORY: No shortness of breath, cough or sputum.  GASTROINTESTINAL: No anorexia, nausea, vomiting or diarrhea. No abdominal pain or blood.  GENITOURINARY: No burning on urination, no polyuria NEUROLOGICAL: No headache, dizziness, syncope, paralysis, ataxia, numbness or tingling in the extremities. No change in bowel or bladder control.  MUSCULOSKELETAL: No muscle, back pain, joint pain or stiffness.  LYMPHATICS: No enlarged nodes. No history of splenectomy.  PSYCHIATRIC: No history of  depression or anxiety.  ENDOCRINOLOGIC: No reports of sweating, cold or heat intolerance. No polyuria or polydipsia.  Marland Kitchen   Physical Examination Vitals:   04/10/17 1604  BP: 118/70  Pulse: 76   Vitals:   04/10/17 1604  Weight: 176 lb (79.8 kg)  Height: 6' (1.829 m)    Gen: resting comfortably, no acute distress HEENT: no scleral icterus, pupils equal round and reactive, no palptable cervical adenopathy,  CV: RRR, no m/r/g, no jvd Resp: Clear to auscultation bilaterally GI: abdomen is soft, non-tender, non-distended, normal bowel sounds, no  hepatosplenomegaly MSK: extremities are warm, no edema.  Skin: warm, no rash Neuro:  no focal deficits Psych: appropriate affect   Diagnostic Studies  02/2017 event monitor   Telemetry tracings shows sinus rhythm  Reported symptoms correlate with sinus with occasional PVCs  Assessment and Plan  1. Palpitations - event monitor with occasional PVCs - symptoms mild, patient not interested in medical therapy at this time - continue to wean caffeine - episode of fatigue likely due to working outside in the heat without eating or drinking, not consistent with cardiac pathology.       Arnoldo Lenis, M.D.

## 2017-04-10 NOTE — Patient Instructions (Signed)

## 2017-10-27 DIAGNOSIS — E748 Other specified disorders of carbohydrate metabolism: Secondary | ICD-10-CM | POA: Diagnosis not present

## 2018-02-02 DIAGNOSIS — E782 Mixed hyperlipidemia: Secondary | ICD-10-CM | POA: Diagnosis not present

## 2018-02-02 DIAGNOSIS — K219 Gastro-esophageal reflux disease without esophagitis: Secondary | ICD-10-CM | POA: Diagnosis not present

## 2018-02-02 DIAGNOSIS — Z6826 Body mass index (BMI) 26.0-26.9, adult: Secondary | ICD-10-CM | POA: Diagnosis not present

## 2018-02-02 DIAGNOSIS — E663 Overweight: Secondary | ICD-10-CM | POA: Diagnosis not present

## 2018-02-02 DIAGNOSIS — I10 Essential (primary) hypertension: Secondary | ICD-10-CM | POA: Diagnosis not present

## 2018-02-02 DIAGNOSIS — G894 Chronic pain syndrome: Secondary | ICD-10-CM | POA: Diagnosis not present

## 2018-02-02 DIAGNOSIS — Z1389 Encounter for screening for other disorder: Secondary | ICD-10-CM | POA: Diagnosis not present

## 2018-05-02 DIAGNOSIS — Z6825 Body mass index (BMI) 25.0-25.9, adult: Secondary | ICD-10-CM | POA: Diagnosis not present

## 2018-05-02 DIAGNOSIS — E663 Overweight: Secondary | ICD-10-CM | POA: Diagnosis not present

## 2018-05-02 DIAGNOSIS — G894 Chronic pain syndrome: Secondary | ICD-10-CM | POA: Diagnosis not present

## 2018-05-02 DIAGNOSIS — I10 Essential (primary) hypertension: Secondary | ICD-10-CM | POA: Diagnosis not present

## 2018-07-11 IMAGING — DX DG CHEST 2V
2 series · 2 of 2 positions shown · non-contrast
Comparison: May 17, 2014 chest radiograph and chest CT

CLINICAL DATA: Cardiac palpitations

EXAM:
CHEST  2 VIEW

[chest pa]
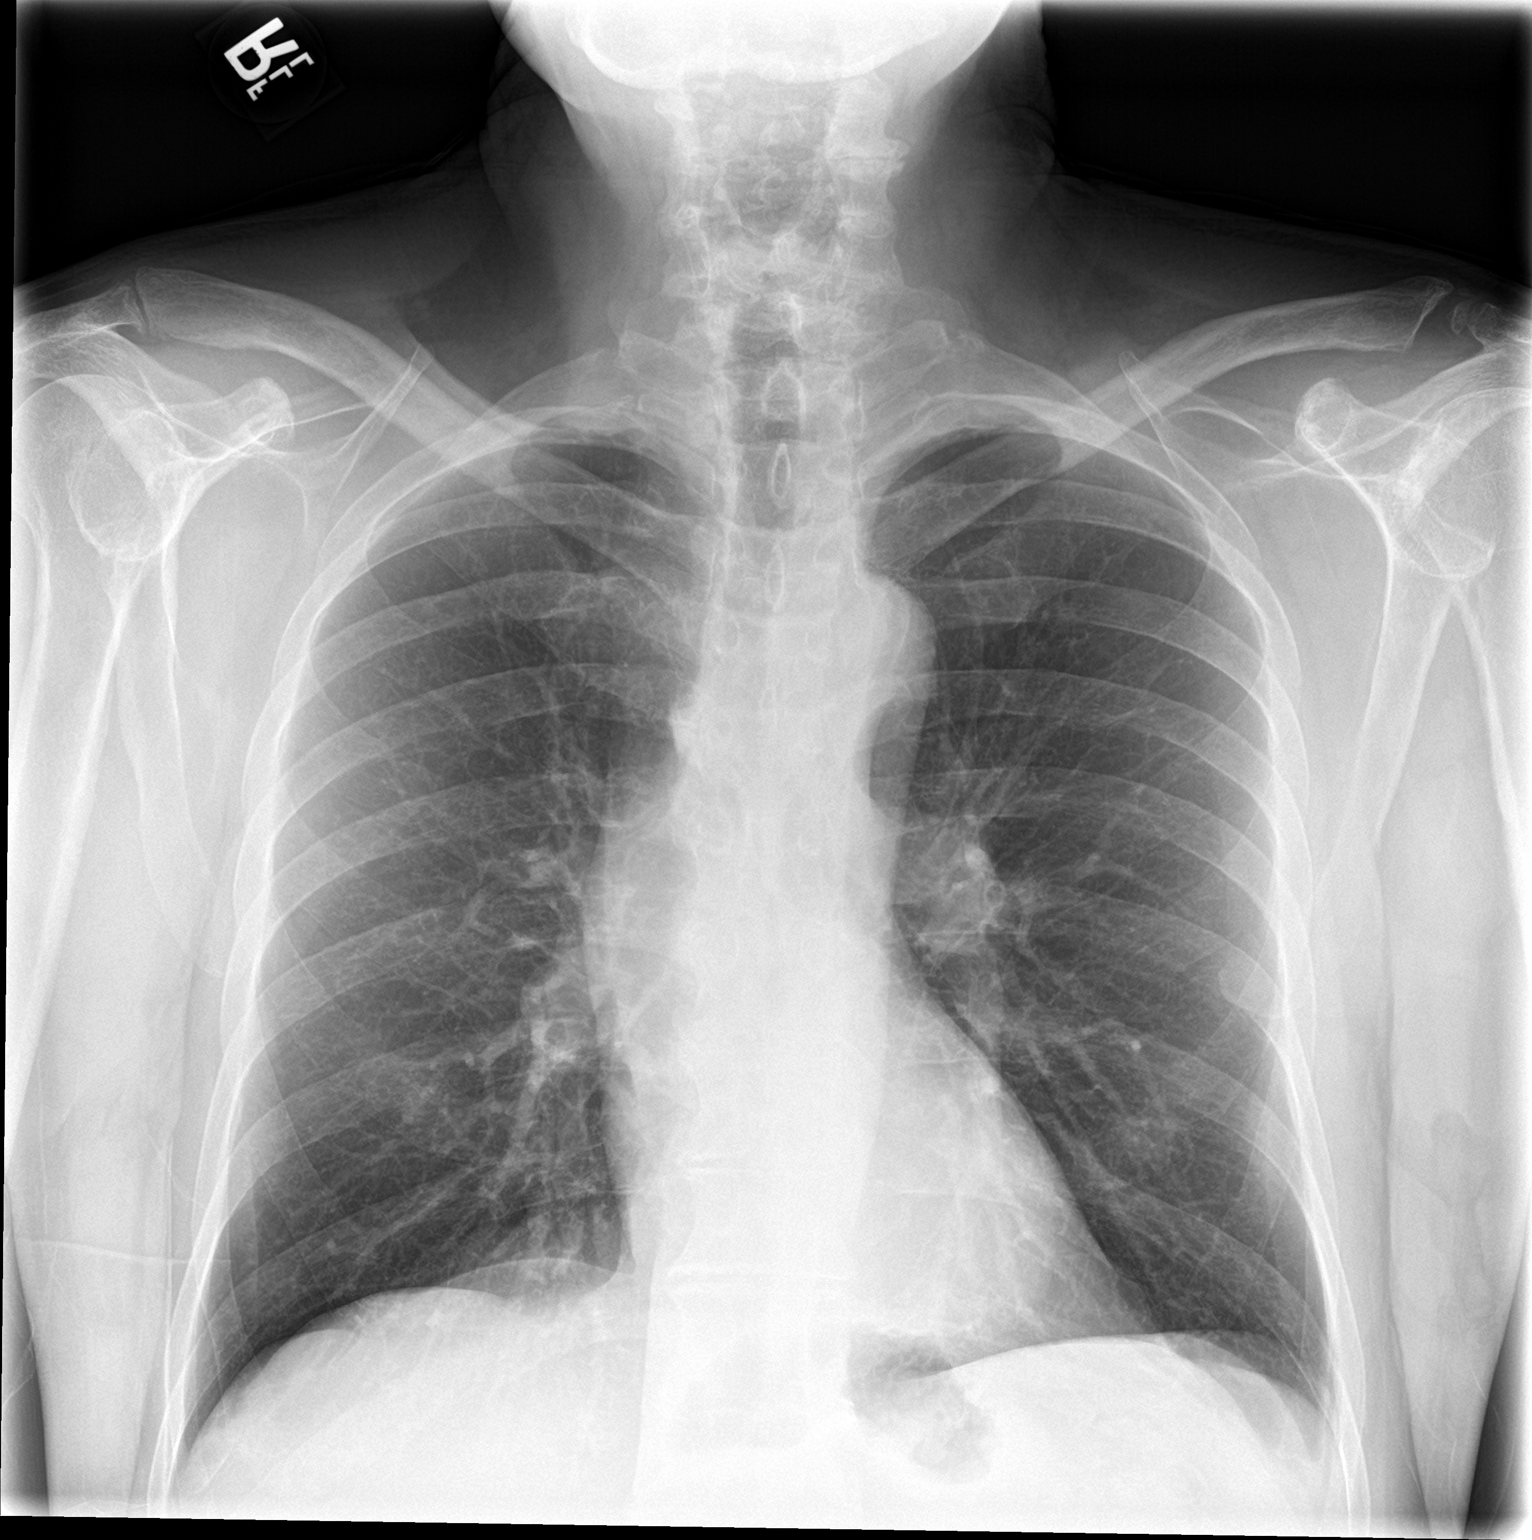

[chest lat]
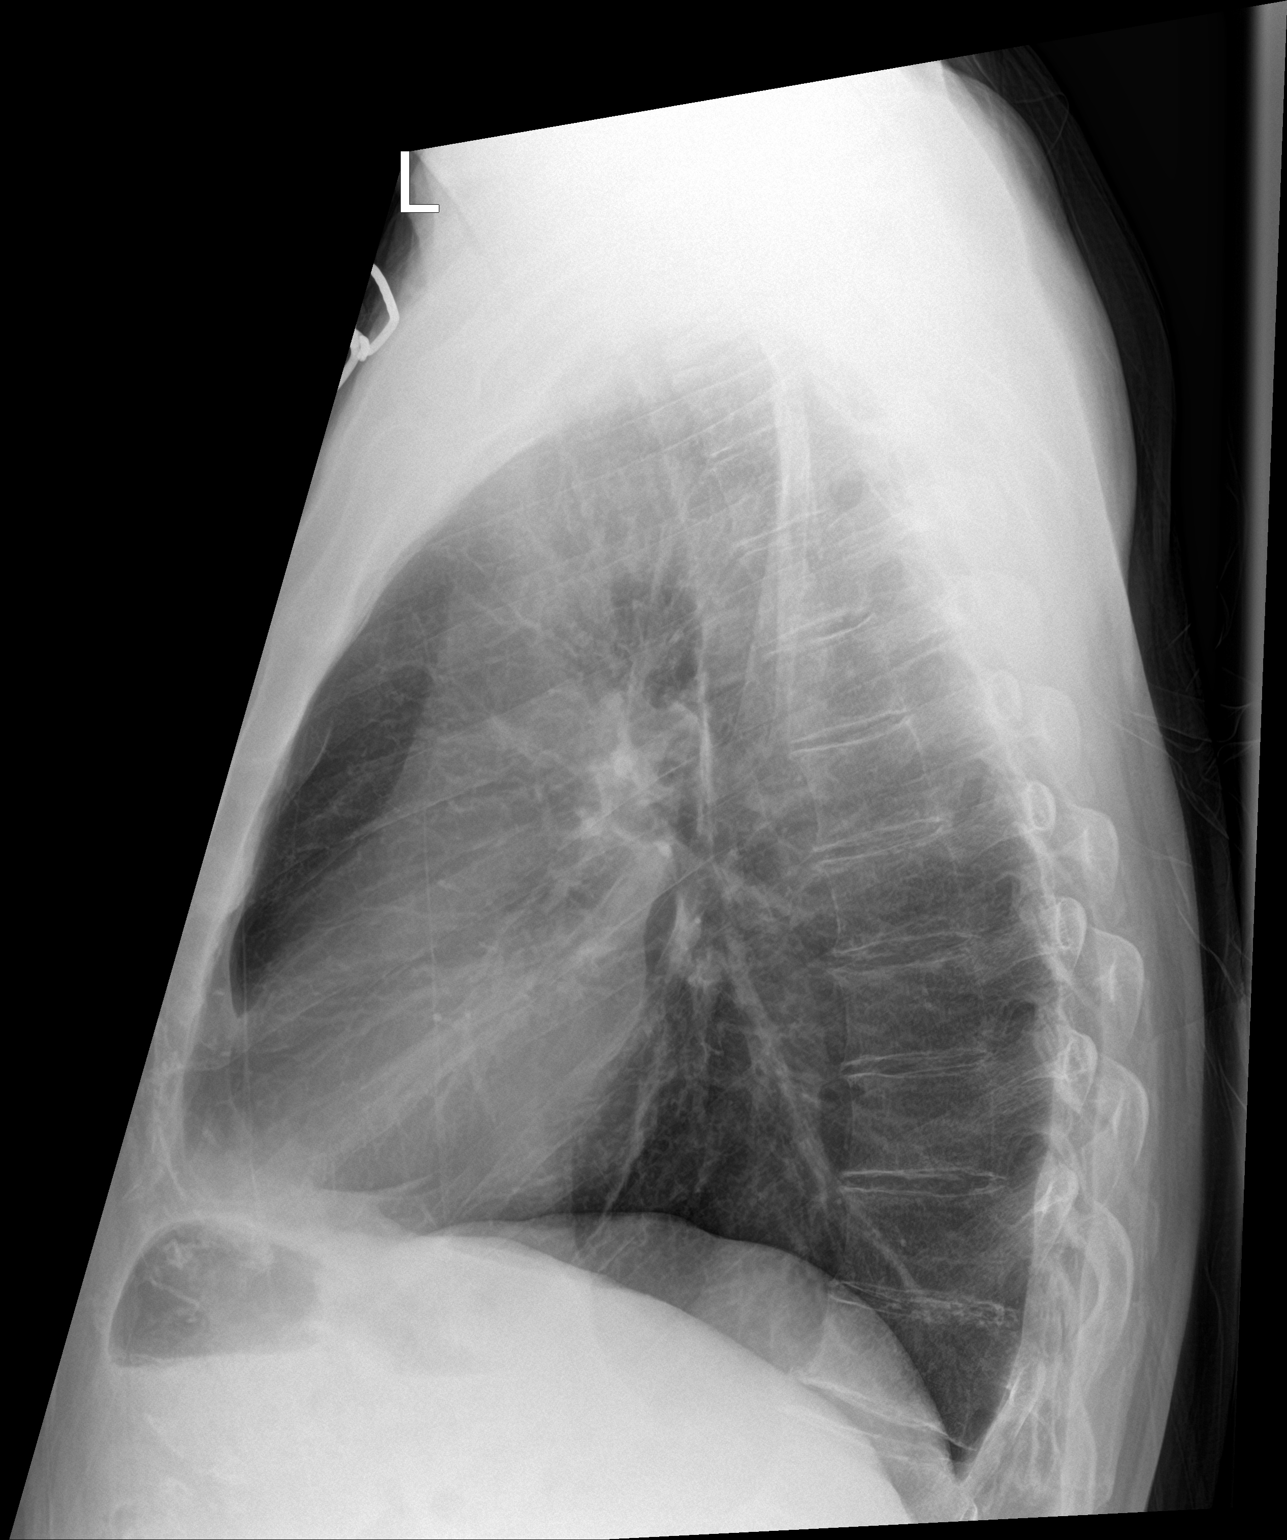

[2 of 2 positions shown; findings below may reference images not displayed]

FINDINGS: There is no edema or consolidation. The heart size and pulmonary
vascularity are normal. No adenopathy. There is degenerative change
in each shoulder.
IMPRESSION: No edema or consolidation.

## 2018-08-03 DIAGNOSIS — Z6825 Body mass index (BMI) 25.0-25.9, adult: Secondary | ICD-10-CM | POA: Diagnosis not present

## 2018-08-03 DIAGNOSIS — G894 Chronic pain syndrome: Secondary | ICD-10-CM | POA: Diagnosis not present

## 2018-08-03 DIAGNOSIS — E663 Overweight: Secondary | ICD-10-CM | POA: Diagnosis not present

## 2018-08-03 DIAGNOSIS — Z1389 Encounter for screening for other disorder: Secondary | ICD-10-CM | POA: Diagnosis not present

## 2018-08-03 DIAGNOSIS — I10 Essential (primary) hypertension: Secondary | ICD-10-CM | POA: Diagnosis not present

## 2018-08-03 DIAGNOSIS — R946 Abnormal results of thyroid function studies: Secondary | ICD-10-CM | POA: Diagnosis not present

## 2018-08-03 DIAGNOSIS — E782 Mixed hyperlipidemia: Secondary | ICD-10-CM | POA: Diagnosis not present

## 2018-08-03 DIAGNOSIS — E785 Hyperlipidemia, unspecified: Secondary | ICD-10-CM | POA: Diagnosis not present

## 2018-08-03 DIAGNOSIS — Z0001 Encounter for general adult medical examination with abnormal findings: Secondary | ICD-10-CM | POA: Diagnosis not present

## 2018-08-03 DIAGNOSIS — Z23 Encounter for immunization: Secondary | ICD-10-CM | POA: Diagnosis not present

## 2018-08-03 DIAGNOSIS — Z125 Encounter for screening for malignant neoplasm of prostate: Secondary | ICD-10-CM | POA: Diagnosis not present

## 2018-08-03 DIAGNOSIS — M2550 Pain in unspecified joint: Secondary | ICD-10-CM | POA: Diagnosis not present

## 2018-08-03 DIAGNOSIS — K219 Gastro-esophageal reflux disease without esophagitis: Secondary | ICD-10-CM | POA: Diagnosis not present

## 2018-08-03 DIAGNOSIS — R7309 Other abnormal glucose: Secondary | ICD-10-CM | POA: Diagnosis not present

## 2018-10-30 DIAGNOSIS — Z6825 Body mass index (BMI) 25.0-25.9, adult: Secondary | ICD-10-CM | POA: Diagnosis not present

## 2018-10-30 DIAGNOSIS — G894 Chronic pain syndrome: Secondary | ICD-10-CM | POA: Diagnosis not present

## 2018-10-30 DIAGNOSIS — E663 Overweight: Secondary | ICD-10-CM | POA: Diagnosis not present

## 2018-11-27 DIAGNOSIS — E663 Overweight: Secondary | ICD-10-CM | POA: Diagnosis not present

## 2018-11-27 DIAGNOSIS — Z6825 Body mass index (BMI) 25.0-25.9, adult: Secondary | ICD-10-CM | POA: Diagnosis not present

## 2018-11-27 DIAGNOSIS — G894 Chronic pain syndrome: Secondary | ICD-10-CM | POA: Diagnosis not present

## 2018-12-31 DIAGNOSIS — G894 Chronic pain syndrome: Secondary | ICD-10-CM | POA: Diagnosis not present

## 2018-12-31 DIAGNOSIS — E663 Overweight: Secondary | ICD-10-CM | POA: Diagnosis not present

## 2018-12-31 DIAGNOSIS — Z1389 Encounter for screening for other disorder: Secondary | ICD-10-CM | POA: Diagnosis not present

## 2018-12-31 DIAGNOSIS — Z6825 Body mass index (BMI) 25.0-25.9, adult: Secondary | ICD-10-CM | POA: Diagnosis not present

## 2019-01-28 DIAGNOSIS — G894 Chronic pain syndrome: Secondary | ICD-10-CM | POA: Diagnosis not present

## 2019-01-28 DIAGNOSIS — M1991 Primary osteoarthritis, unspecified site: Secondary | ICD-10-CM | POA: Diagnosis not present

## 2019-02-28 DIAGNOSIS — M1991 Primary osteoarthritis, unspecified site: Secondary | ICD-10-CM | POA: Diagnosis not present

## 2019-02-28 DIAGNOSIS — G894 Chronic pain syndrome: Secondary | ICD-10-CM | POA: Diagnosis not present

## 2019-03-29 DIAGNOSIS — G894 Chronic pain syndrome: Secondary | ICD-10-CM | POA: Diagnosis not present

## 2019-04-29 DIAGNOSIS — M1991 Primary osteoarthritis, unspecified site: Secondary | ICD-10-CM | POA: Diagnosis not present

## 2019-04-29 DIAGNOSIS — G894 Chronic pain syndrome: Secondary | ICD-10-CM | POA: Diagnosis not present

## 2019-05-28 DIAGNOSIS — G894 Chronic pain syndrome: Secondary | ICD-10-CM | POA: Diagnosis not present

## 2019-06-25 DIAGNOSIS — G894 Chronic pain syndrome: Secondary | ICD-10-CM | POA: Diagnosis not present

## 2019-07-29 DIAGNOSIS — G894 Chronic pain syndrome: Secondary | ICD-10-CM | POA: Diagnosis not present

## 2019-08-27 DIAGNOSIS — G894 Chronic pain syndrome: Secondary | ICD-10-CM | POA: Diagnosis not present

## 2019-09-24 DIAGNOSIS — G894 Chronic pain syndrome: Secondary | ICD-10-CM | POA: Diagnosis not present

## 2019-11-20 DIAGNOSIS — G894 Chronic pain syndrome: Secondary | ICD-10-CM | POA: Diagnosis not present

## 2019-12-18 DIAGNOSIS — G894 Chronic pain syndrome: Secondary | ICD-10-CM | POA: Diagnosis not present

## 2020-01-16 ENCOUNTER — Ambulatory Visit (HOSPITAL_COMMUNITY)
Admission: RE | Admit: 2020-01-16 | Discharge: 2020-01-16 | Disposition: A | Discharge status: Home / Self Care | Payer: Medicare Other | Source: Ambulatory Visit | Attending: Physician Assistant | Admitting: Physician Assistant

## 2020-01-16 ENCOUNTER — Other Ambulatory Visit: Payer: Self-pay

## 2020-01-16 ENCOUNTER — Other Ambulatory Visit (HOSPITAL_COMMUNITY): Payer: Self-pay | Admitting: Physician Assistant

## 2020-01-16 ENCOUNTER — Encounter (HOSPITAL_COMMUNITY): Payer: Self-pay

## 2020-01-16 DIAGNOSIS — R52 Pain, unspecified: Secondary | ICD-10-CM

## 2020-01-16 DIAGNOSIS — M25572 Pain in left ankle and joints of left foot: Secondary | ICD-10-CM | POA: Insufficient documentation

## 2020-01-16 DIAGNOSIS — M79672 Pain in left foot: Secondary | ICD-10-CM | POA: Diagnosis not present

## 2020-01-16 DIAGNOSIS — M7989 Other specified soft tissue disorders: Secondary | ICD-10-CM | POA: Diagnosis not present

## 2020-01-16 DIAGNOSIS — G894 Chronic pain syndrome: Secondary | ICD-10-CM | POA: Diagnosis not present

## 2020-01-16 DIAGNOSIS — S8255XA Nondisplaced fracture of medial malleolus of left tibia, initial encounter for closed fracture: Secondary | ICD-10-CM | POA: Diagnosis not present

## 2020-01-16 DIAGNOSIS — M1991 Primary osteoarthritis, unspecified site: Secondary | ICD-10-CM | POA: Diagnosis not present

## 2020-01-20 DIAGNOSIS — S82892D Other fracture of left lower leg, subsequent encounter for closed fracture with routine healing: Secondary | ICD-10-CM | POA: Diagnosis not present

## 2020-01-20 DIAGNOSIS — Z6825 Body mass index (BMI) 25.0-25.9, adult: Secondary | ICD-10-CM | POA: Diagnosis not present

## 2020-01-20 DIAGNOSIS — E663 Overweight: Secondary | ICD-10-CM | POA: Diagnosis not present

## 2020-01-28 DIAGNOSIS — S93491A Sprain of other ligament of right ankle, initial encounter: Secondary | ICD-10-CM | POA: Diagnosis not present

## 2020-01-28 DIAGNOSIS — M25572 Pain in left ankle and joints of left foot: Secondary | ICD-10-CM | POA: Diagnosis not present

## 2020-01-31 DIAGNOSIS — M25572 Pain in left ankle and joints of left foot: Secondary | ICD-10-CM | POA: Diagnosis not present

## 2020-02-17 DIAGNOSIS — G894 Chronic pain syndrome: Secondary | ICD-10-CM | POA: Diagnosis not present

## 2020-02-17 DIAGNOSIS — M1991 Primary osteoarthritis, unspecified site: Secondary | ICD-10-CM | POA: Diagnosis not present

## 2020-03-18 DIAGNOSIS — G894 Chronic pain syndrome: Secondary | ICD-10-CM | POA: Diagnosis not present

## 2020-03-18 DIAGNOSIS — M1991 Primary osteoarthritis, unspecified site: Secondary | ICD-10-CM | POA: Diagnosis not present

## 2020-04-21 ENCOUNTER — Other Ambulatory Visit (HOSPITAL_COMMUNITY): Payer: Self-pay | Admitting: Physician Assistant

## 2020-04-21 ENCOUNTER — Other Ambulatory Visit: Payer: Self-pay

## 2020-04-21 ENCOUNTER — Ambulatory Visit (HOSPITAL_COMMUNITY)
Admission: RE | Admit: 2020-04-21 | Discharge: 2020-04-21 | Disposition: A | Discharge status: Home / Self Care | Payer: Medicare Other | Source: Ambulatory Visit | Attending: Physician Assistant | Admitting: Physician Assistant

## 2020-04-21 DIAGNOSIS — R5383 Other fatigue: Secondary | ICD-10-CM | POA: Diagnosis not present

## 2020-04-21 DIAGNOSIS — Z0001 Encounter for general adult medical examination with abnormal findings: Secondary | ICD-10-CM | POA: Diagnosis not present

## 2020-04-21 DIAGNOSIS — Z0000 Encounter for general adult medical examination without abnormal findings: Secondary | ICD-10-CM | POA: Diagnosis not present

## 2020-04-21 DIAGNOSIS — E782 Mixed hyperlipidemia: Secondary | ICD-10-CM | POA: Diagnosis not present

## 2020-05-18 DIAGNOSIS — G894 Chronic pain syndrome: Secondary | ICD-10-CM | POA: Diagnosis not present

## 2020-06-16 DIAGNOSIS — G894 Chronic pain syndrome: Secondary | ICD-10-CM | POA: Diagnosis not present

## 2020-07-14 DIAGNOSIS — G894 Chronic pain syndrome: Secondary | ICD-10-CM | POA: Diagnosis not present

## 2020-07-14 DIAGNOSIS — E7849 Other hyperlipidemia: Secondary | ICD-10-CM | POA: Diagnosis not present

## 2020-07-14 DIAGNOSIS — I10 Essential (primary) hypertension: Secondary | ICD-10-CM | POA: Diagnosis not present

## 2020-07-14 DIAGNOSIS — Z6825 Body mass index (BMI) 25.0-25.9, adult: Secondary | ICD-10-CM | POA: Diagnosis not present

## 2020-07-14 DIAGNOSIS — E663 Overweight: Secondary | ICD-10-CM | POA: Diagnosis not present

## 2020-08-11 DIAGNOSIS — Z20822 Contact with and (suspected) exposure to covid-19: Secondary | ICD-10-CM | POA: Diagnosis not present

## 2020-08-11 DIAGNOSIS — Z03818 Encounter for observation for suspected exposure to other biological agents ruled out: Secondary | ICD-10-CM | POA: Diagnosis not present

## 2020-08-13 DIAGNOSIS — M1991 Primary osteoarthritis, unspecified site: Secondary | ICD-10-CM | POA: Diagnosis not present

## 2020-08-13 DIAGNOSIS — G894 Chronic pain syndrome: Secondary | ICD-10-CM | POA: Diagnosis not present

## 2020-09-10 DIAGNOSIS — G894 Chronic pain syndrome: Secondary | ICD-10-CM | POA: Diagnosis not present

## 2020-09-10 DIAGNOSIS — G4700 Insomnia, unspecified: Secondary | ICD-10-CM | POA: Diagnosis not present

## 2020-09-10 DIAGNOSIS — M1991 Primary osteoarthritis, unspecified site: Secondary | ICD-10-CM | POA: Diagnosis not present

## 2020-09-10 DIAGNOSIS — Z681 Body mass index (BMI) 19 or less, adult: Secondary | ICD-10-CM | POA: Diagnosis not present

## 2020-09-18 DIAGNOSIS — Z23 Encounter for immunization: Secondary | ICD-10-CM | POA: Diagnosis not present

## 2020-11-06 DIAGNOSIS — G894 Chronic pain syndrome: Secondary | ICD-10-CM | POA: Diagnosis not present

## 2020-12-07 DIAGNOSIS — G894 Chronic pain syndrome: Secondary | ICD-10-CM | POA: Diagnosis not present

## 2020-12-07 DIAGNOSIS — M1991 Primary osteoarthritis, unspecified site: Secondary | ICD-10-CM | POA: Diagnosis not present

## 2020-12-07 DIAGNOSIS — Z6826 Body mass index (BMI) 26.0-26.9, adult: Secondary | ICD-10-CM | POA: Diagnosis not present

## 2020-12-07 DIAGNOSIS — E663 Overweight: Secondary | ICD-10-CM | POA: Diagnosis not present

## 2021-01-04 DIAGNOSIS — G894 Chronic pain syndrome: Secondary | ICD-10-CM | POA: Diagnosis not present

## 2021-01-04 DIAGNOSIS — M1991 Primary osteoarthritis, unspecified site: Secondary | ICD-10-CM | POA: Diagnosis not present

## 2021-01-07 ENCOUNTER — Encounter: Payer: Self-pay | Admitting: Internal Medicine

## 2021-02-03 DIAGNOSIS — G894 Chronic pain syndrome: Secondary | ICD-10-CM | POA: Diagnosis not present

## 2021-03-04 DIAGNOSIS — Z6826 Body mass index (BMI) 26.0-26.9, adult: Secondary | ICD-10-CM | POA: Diagnosis not present

## 2021-03-04 DIAGNOSIS — G894 Chronic pain syndrome: Secondary | ICD-10-CM | POA: Diagnosis not present

## 2021-03-04 DIAGNOSIS — E663 Overweight: Secondary | ICD-10-CM | POA: Diagnosis not present

## 2021-03-04 DIAGNOSIS — M1991 Primary osteoarthritis, unspecified site: Secondary | ICD-10-CM | POA: Diagnosis not present

## 2021-04-01 DIAGNOSIS — M1991 Primary osteoarthritis, unspecified site: Secondary | ICD-10-CM | POA: Diagnosis not present

## 2021-04-01 DIAGNOSIS — G894 Chronic pain syndrome: Secondary | ICD-10-CM | POA: Diagnosis not present

## 2021-05-05 DIAGNOSIS — Z1389 Encounter for screening for other disorder: Secondary | ICD-10-CM | POA: Diagnosis not present

## 2021-05-05 DIAGNOSIS — E663 Overweight: Secondary | ICD-10-CM | POA: Diagnosis not present

## 2021-05-05 DIAGNOSIS — Z1331 Encounter for screening for depression: Secondary | ICD-10-CM | POA: Diagnosis not present

## 2021-05-05 DIAGNOSIS — G894 Chronic pain syndrome: Secondary | ICD-10-CM | POA: Diagnosis not present

## 2021-05-05 DIAGNOSIS — Z0001 Encounter for general adult medical examination with abnormal findings: Secondary | ICD-10-CM | POA: Diagnosis not present

## 2021-05-05 DIAGNOSIS — Z6826 Body mass index (BMI) 26.0-26.9, adult: Secondary | ICD-10-CM | POA: Diagnosis not present

## 2021-05-05 DIAGNOSIS — Z23 Encounter for immunization: Secondary | ICD-10-CM | POA: Diagnosis not present

## 2021-05-05 DIAGNOSIS — Z0000 Encounter for general adult medical examination without abnormal findings: Secondary | ICD-10-CM | POA: Diagnosis not present

## 2021-05-05 DIAGNOSIS — E782 Mixed hyperlipidemia: Secondary | ICD-10-CM | POA: Diagnosis not present

## 2021-05-12 ENCOUNTER — Encounter: Payer: Self-pay | Admitting: Internal Medicine

## 2021-06-02 IMAGING — DX DG ANKLE COMPLETE 3+V*L*
3 series · 3 of 3 positions shown · non-contrast
Comparison: No recent prior.

CLINICAL DATA: Fall.  Left ankle pain and swelling.

EXAM:
LEFT ANKLE COMPLETE - 3+ VIEW

[ankle ap]
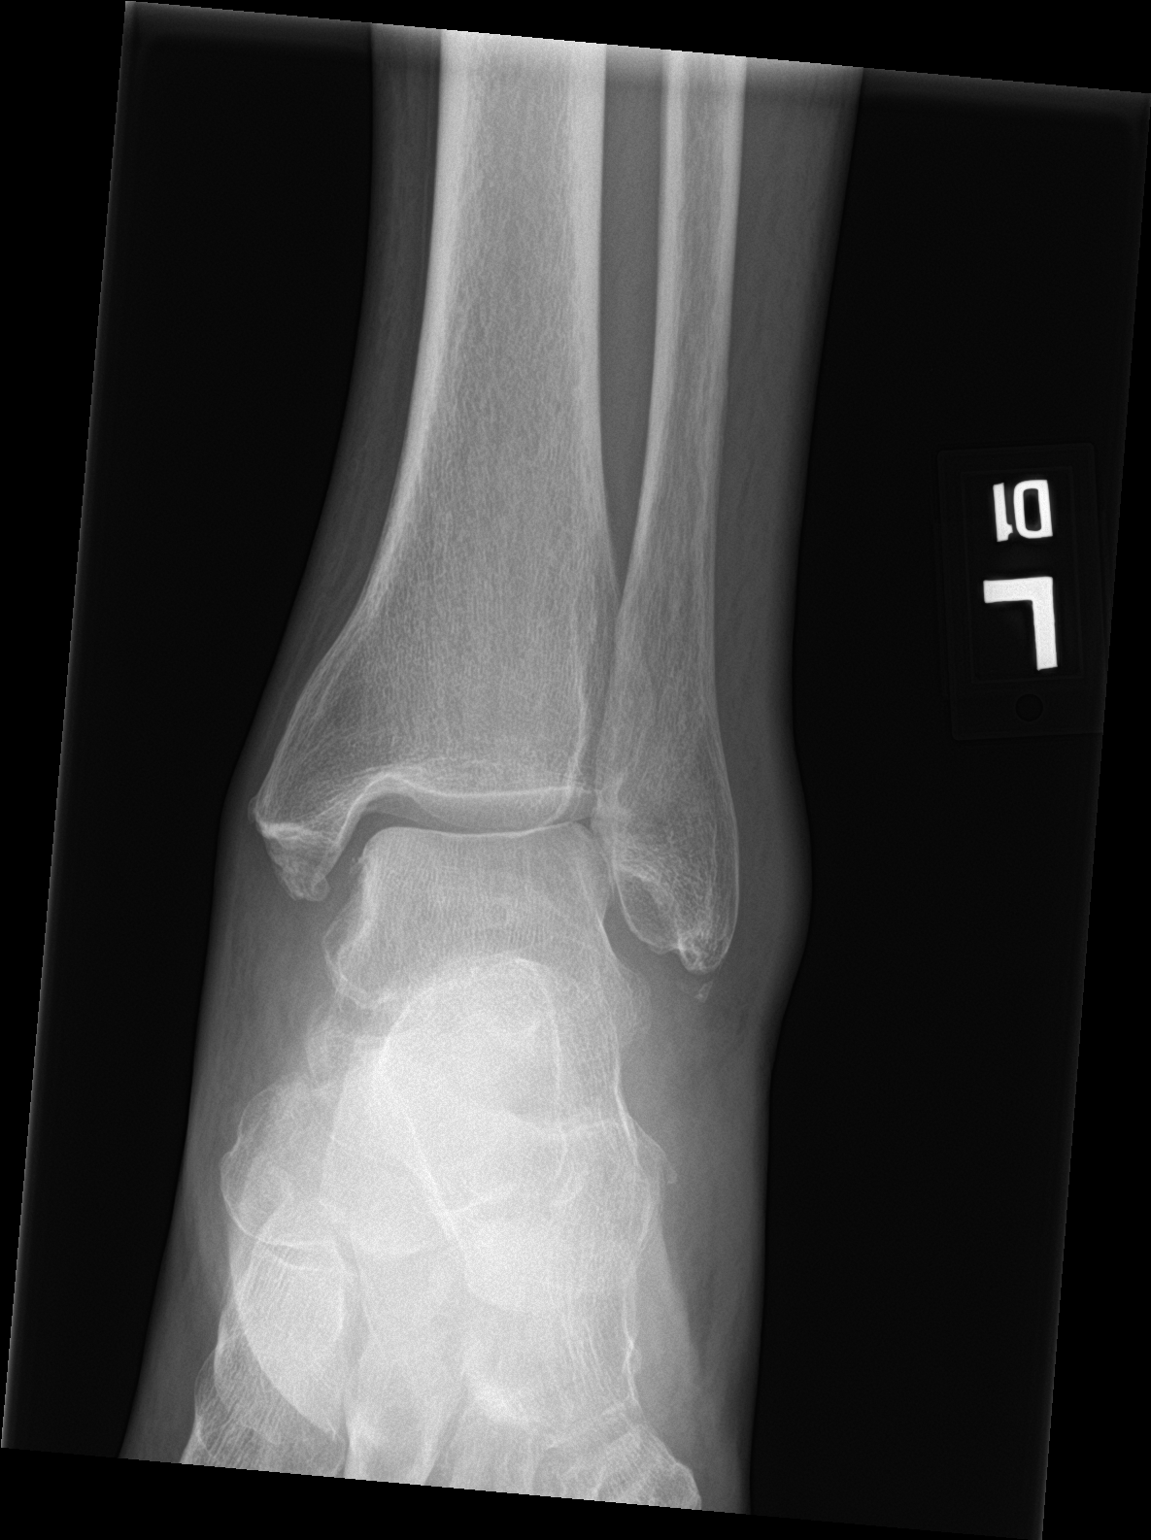

[ankle obl]
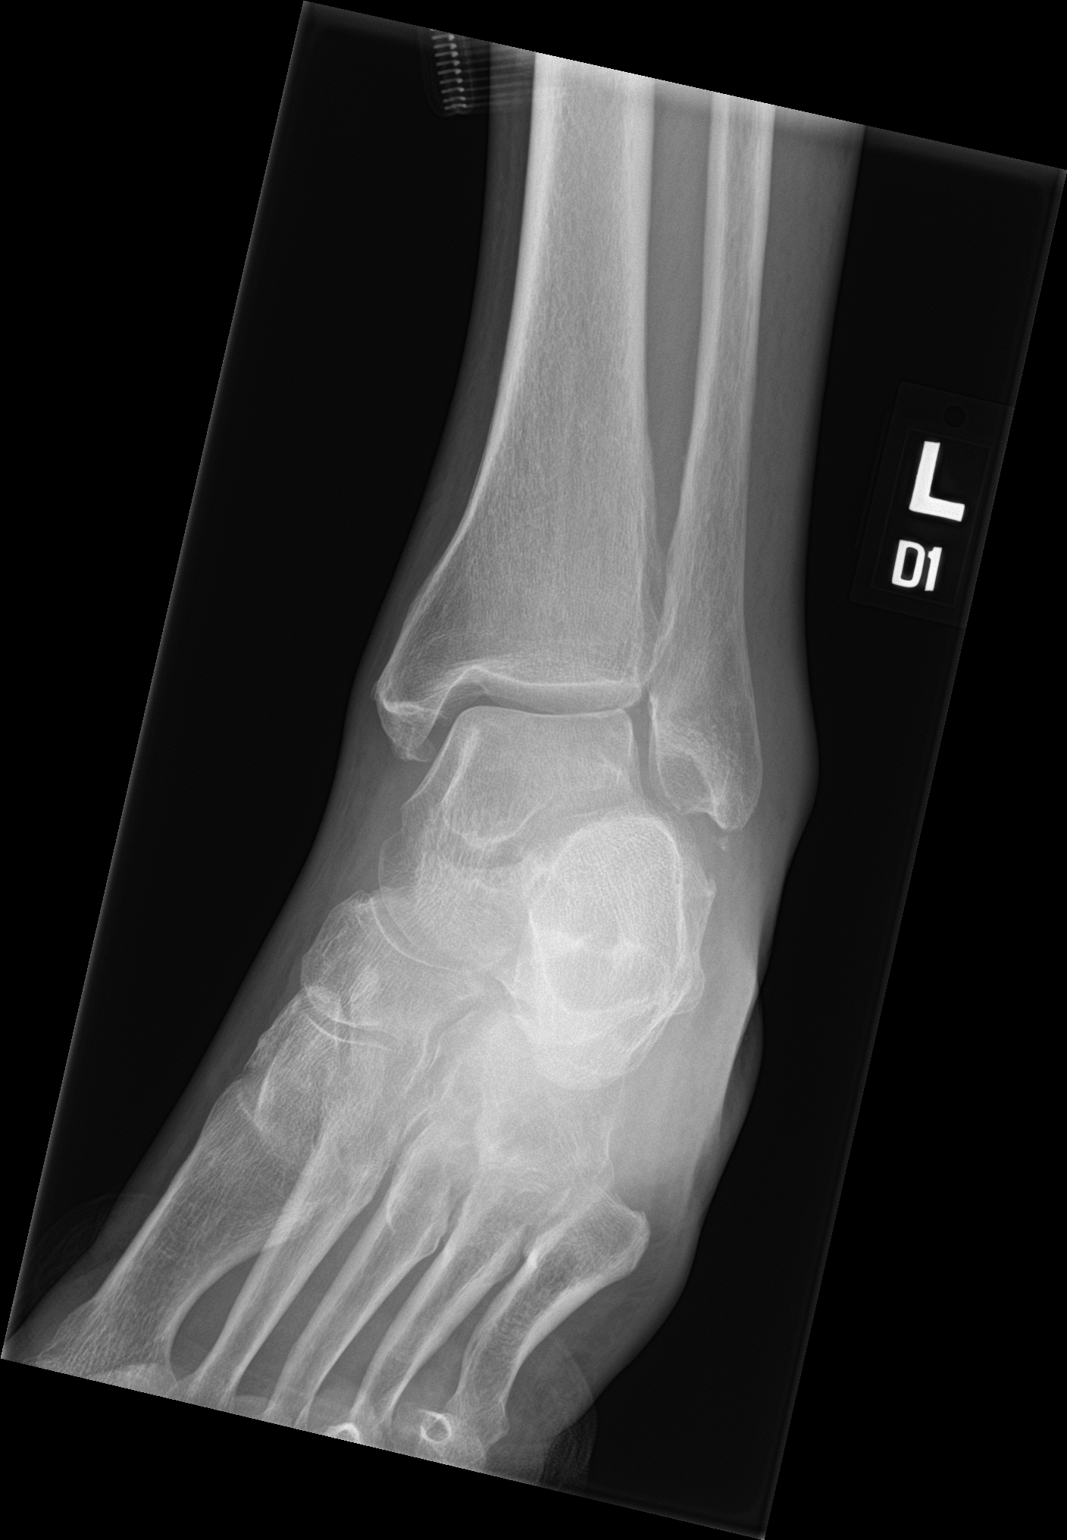

[ankle lat]
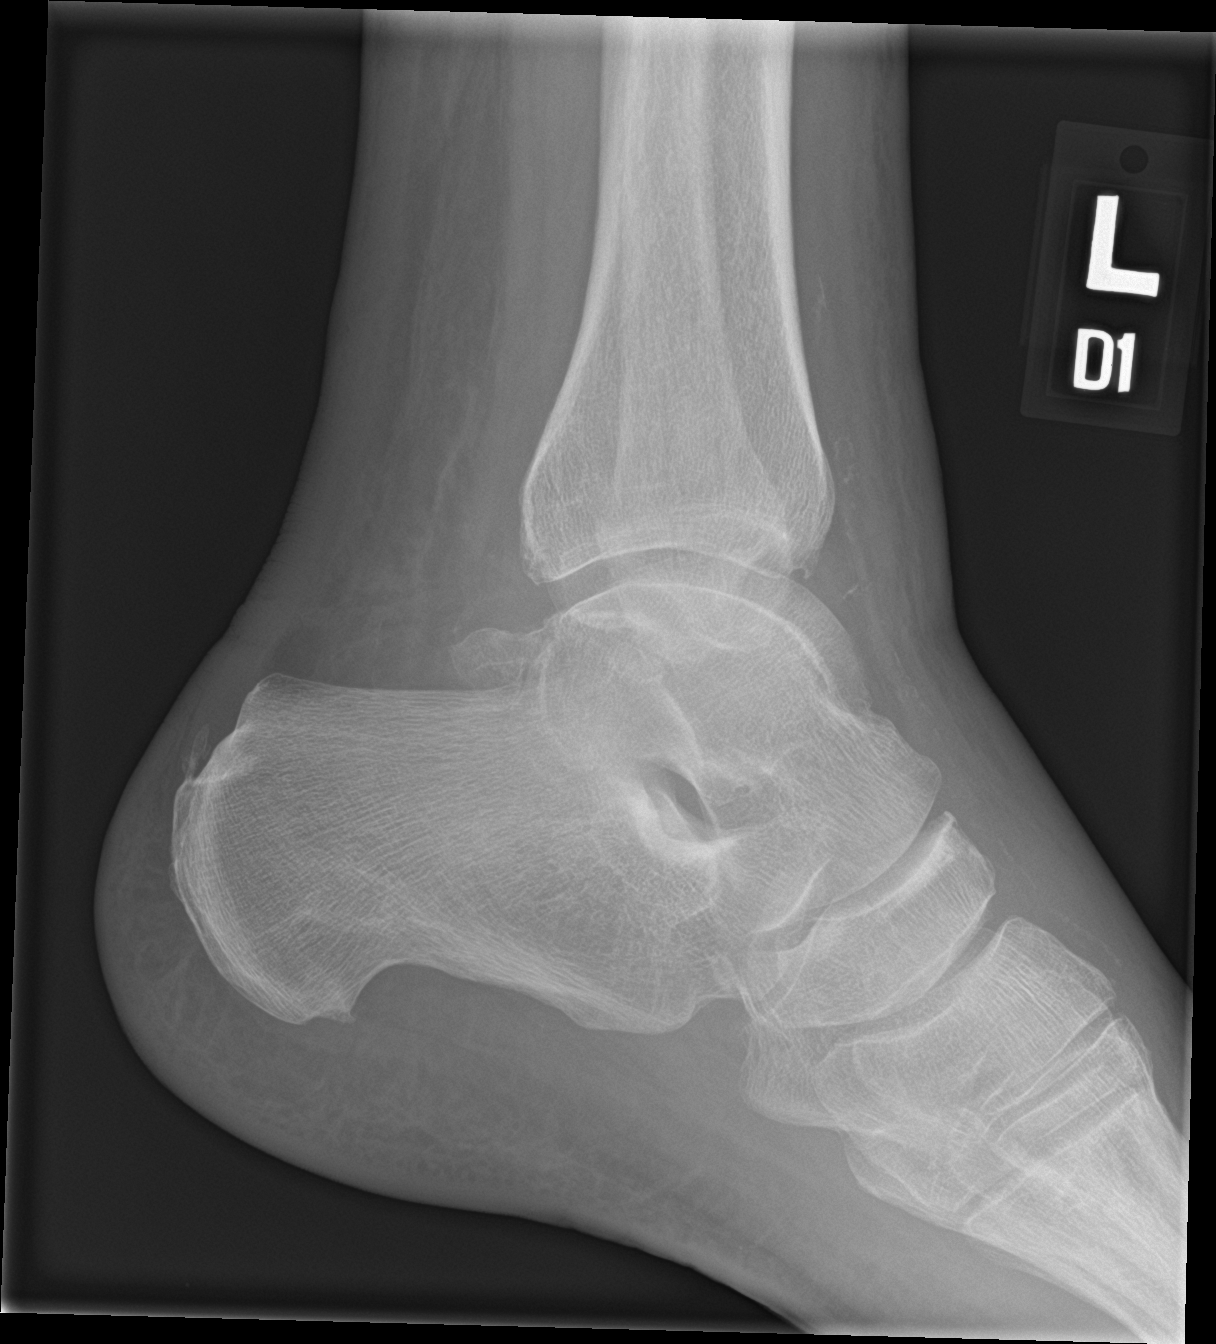

[3 of 3 positions shown; findings below may reference images not displayed]

FINDINGS: Diffuse soft tissue swelling, most prominent laterally. Nondisplaced
fracture of the distal tip of the medial malleolus cannot be
excluded. Tiny bony density noted adjacent to the medial talus. This
may represent a small avulsion fracture, age undetermined. Tiny bony
density noted adjacent to the lateral malleolus consistent with tiny
avulsion fracture, age undetermined. Diffuse degenerative change.
Peripheral vascular calcification.
IMPRESSION: 1. Nondisplaced fracture of the distal tip of the medial malleolus
cannot be excluded. Tiny bony density noted adjacent to the medial
talus. This may represent small avulsion fracture, age undetermined.

2. Tiny bony density noted adjacent to the lateral malleolus
consistent with tiny avulsion fracture, age undetermined.

3.  Diffuse degenerative change.

4.  Peripheral vascular disease.

## 2021-06-03 DIAGNOSIS — G894 Chronic pain syndrome: Secondary | ICD-10-CM | POA: Diagnosis not present

## 2021-06-03 DIAGNOSIS — M1991 Primary osteoarthritis, unspecified site: Secondary | ICD-10-CM | POA: Diagnosis not present

## 2021-07-01 DIAGNOSIS — I10 Essential (primary) hypertension: Secondary | ICD-10-CM | POA: Diagnosis not present

## 2021-07-01 DIAGNOSIS — G894 Chronic pain syndrome: Secondary | ICD-10-CM | POA: Diagnosis not present

## 2021-07-01 DIAGNOSIS — M1991 Primary osteoarthritis, unspecified site: Secondary | ICD-10-CM | POA: Diagnosis not present

## 2021-07-22 ENCOUNTER — Other Ambulatory Visit: Payer: Self-pay

## 2021-07-22 ENCOUNTER — Ambulatory Visit (INDEPENDENT_AMBULATORY_CARE_PROVIDER_SITE_OTHER): Payer: Self-pay | Admitting: *Deleted

## 2021-07-22 VITALS — Ht 72.0 in | Wt 191.2 lb

## 2021-07-22 DIAGNOSIS — Z1211 Encounter for screening for malignant neoplasm of colon: Secondary | ICD-10-CM

## 2021-07-22 NOTE — Progress Notes (Signed)
Gastroenterology Pre-Procedure Review  Request Date: 07/22/2021 Requesting Physician: Edythe Clarity, PA-C @ [place], 10 year recall, Last TCS done 02/14/2011 by Dr. Gala Romney, normal rectum, diverticulosis  PATIENT REVIEW QUESTIONS: The patient responded to the following health history questions as indicated:    1. Diabetes Melitis: no 2. Joint replacements in the past 12 months: no 3. Major health problems in the past 3 months: no 4. Has an artificial valve or MVP: no 5. Has a defibrillator: no 6. Has been advised in past to take antibiotics in advance of a procedure like teeth cleaning: no 7. Family history of colon cancer: yes, grandmother: age 75  8. Alcohol Use: yes, 4 beers a week 9. Illicit drug Use: no 10. History of sleep apnea: no  11. History of coronary artery or other vascular stents placed within the last 12 months: no 12. History of any prior anesthesia complications: no 13. Body mass index is 25.93 kg/m.    MEDICATIONS & ALLERGIES:    Patient reports the following regarding taking any blood thinners:   Plavix? no Aspirin? no Coumadin? no Brilinta? no Xarelto? no Eliquis? no Pradaxa? no Savaysa? no Effient? no  Patient confirms/reports the following medications:  Current Outpatient Medications  Medication Sig Dispense Refill   atorvastatin (LIPITOR) 10 MG tablet Take 10 mg by mouth every morning.      HYDROcodone-acetaminophen (NORCO) 7.5-325 MG tablet Take 1 tablet by mouth 4 (four) times daily.     olmesartan (BENICAR) 40 MG tablet Take 40 mg by mouth daily.     pantoprazole (PROTONIX) 40 MG tablet Take 40 mg by mouth every morning.      No current facility-administered medications for this visit.    Patient confirms/reports the following allergies:  No Known Allergies  No orders of the defined types were placed in this encounter.   AUTHORIZATION INFORMATION Primary Insurance: [facility],  ID #: [id number],  Group #: [id number] [place] Pre-Cert / Josem Kaufmann required:  No, not required  SCHEDULE INFORMATION: Procedure has been scheduled as follows:  Date: , Time:   Location: [facility] with Dr. Gala Romney  This Gastroenterology Pre-Precedure Review Form is being routed to the following provider(s): Neil Crouch, PA-C

## 2021-07-23 NOTE — Progress Notes (Signed)
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 ASA II.   Does he need ov? What is current protocol for ASA II/propofol?

## 2021-07-26 NOTE — Progress Notes (Signed)
We have been bringing in Dr. Roseanne Kaufman propofol patients for ov unless otherwise specified by provider.

## 2021-07-26 NOTE — Progress Notes (Signed)
OK OV per protocol.

## 2021-07-27 NOTE — Progress Notes (Signed)
Scheduled ov for 08/16/2021 at 1:30 with Aliene Altes, PA-C.

## 2021-08-02 DIAGNOSIS — M1991 Primary osteoarthritis, unspecified site: Secondary | ICD-10-CM | POA: Diagnosis not present

## 2021-08-02 DIAGNOSIS — G894 Chronic pain syndrome: Secondary | ICD-10-CM | POA: Diagnosis not present

## 2021-08-02 DIAGNOSIS — E782 Mixed hyperlipidemia: Secondary | ICD-10-CM | POA: Diagnosis not present

## 2021-08-14 NOTE — Progress Notes (Signed)
Primary Care Physician:  Ginger Organ Primary Gastroenterologist:  Dr. Gala Romney  Chief Complaint  Patient presents with   Colonoscopy    Due for 10 yr tcs    HPI:   Marc Garcia is a 65 y.o. male presenting today to discuss scheduling colonoscopy.  Last colonoscopy 02/14/2011 with Dr. Gala Romney revealing single anal papilla, scattered left-sided diverticula.  He is due for routine screening.  He was originally scheduled for a nurse triage visit, but due to medications, patient would need propofol, so office visit was arranged.  Today, patient states he is doing well.  No GI concerns.  Denies abdominal pain, constipation, diarrhea, BRBPR, melena, unintentional weight loss.  History of GERD, well controlled on Protonix 40 mg daily.  Denies dysphagia, nausea, vomiting.  Takes Norco due to chronic back, shoulder, and knee pain.  Past Medical History:  Diagnosis Date   Arthritis    Borderline diabetic    Emphysema of lung ([place])    patient denies.   GERD (gastroesophageal reflux disease)    Hyperlipidemia    Hypertension     Past Surgical History:  Procedure Laterality Date   colonscopy   01/2011   Dr. Gala Romney;  single anal papilla, left-sided diverticula   CYSTECTOMY     HERNIA REPAIR     inguinal bilat    KNEE SURGERY Right    times 2   TOTAL KNEE ARTHROPLASTY Right 05/15/2015   Procedure: RIGHT TOTAL KNEE ARTHROPLASTY;  Surgeon: Sydnee Cabal, MD;  Location: [facility];  Service: Orthopedics;  Laterality: Right;    Current Outpatient Medications  Medication Sig Dispense Refill   atorvastatin (LIPITOR) 10 MG tablet Take 10 mg by mouth every morning.      HYDROcodone-acetaminophen (NORCO) 7.5-325 MG tablet Take 1 tablet by mouth 4 (four) times daily.     olmesartan (BENICAR) 40 MG tablet Take 40 mg by mouth daily.     pantoprazole (PROTONIX) 40 MG tablet Take 40 mg by mouth every morning.      No current facility-administered medications for this visit.    Allergies as  of 08/16/2021   (No Known Allergies)    Family History  Problem Relation Age of Onset   Stroke Mother    Cancer Father    Colon cancer Maternal Aunt        61s    Social History   Socioeconomic History   Marital status: Married    Spouse name: Not on file   Number of children: Not on file   Years of education: Not on file   Highest education level: Not on file  Occupational History   Not on file  Tobacco Use   Smoking status: Former    Packs/day: 1.00    Years: 40.00    Pack years: 40.00    Types: Cigarettes    Quit date: 05/14/2012    Years since quitting: 9.2   Smokeless tobacco: Never  Vaping Use   Vaping Use: Never used  Substance and Sexual Activity   Alcohol use: No   Drug use: Yes    Types: Marijuana    Comment: very little   Sexual activity: Not on file  Other Topics Concern   Not on file  Social History Narrative   Not on file   Social Determinants of Health   Financial Resource Strain: Not on file  Food Insecurity: Not on file  Transportation Needs: Not on file  Physical Activity: Not on file  Stress: Not  on file  Social Connections: Not on file  Intimate Partner Violence: Not on file    Review of Systems: Gen: Denies any fever, chills, cold or flulike symptoms, presyncope, syncope. CV: Denies chest pain, heart palpitations. Resp: Denies shortness of breath or cough. GI: See HPI GU : Denies urinary burning, urinary frequency, urinary hesitancy MS: Admits to chronic back, knee, and shoulder pain. Derm: Denies rash. Psych: Denies depression, anxiety. Heme: See HPI  Physical Exam: BP (!) 174/88   Pulse 68   Temp 97.7 F (36.5 C) (Temporal)   Ht 6' (1.829 m)   Wt 195 lb 9.6 oz (88.7 kg)   BMI 26.53 kg/m  General:   Alert and oriented. Pleasant and cooperative. Well-nourished and well-developed.  Head:  Normocephalic and atraumatic. Eyes:  Without icterus, sclera clear and conjunctiva pink.  Ears:  Normal auditory acuity. Lungs:   Clear to auscultation bilaterally. No wheezes, rales, or rhonchi. No distress.  Heart:  S1, S2 present without murmurs appreciated.  Abdomen:  +BS, soft, non-tender and non-distended. No HSM noted. No guarding or rebound. No masses appreciated.  Rectal:  Deferred  Msk:  Symmetrical without gross deformities. Normal posture. Extremities:  Without edema. Neurologic:  Alert and  oriented x4;  grossly normal neurologically. Skin:  Intact without significant lesions or rashes. Psych: Normal mood and affect.    Assessment: 65 year old male with history of HTN, HLD, and chronic pain on Norco presenting today to schedule screening colonoscopy.  Last colonoscopy in April 2012 with single anal papilla, left-sided diverticula.  He is currently due for routine screening.  He has no significant upper or lower GI symptoms.  No alarm symptoms.  Family history of maternal aunt with colon cancer in her 70s.   Plan: Proceed with colonoscopy with propofol with Dr. Gala Romney in the near future. The risks, benefits, and alternatives have been discussed with the patient in detail. The patient states understanding and desires to proceed. ASA II Follow-up as needed.   Aliene Altes, PA-C [facility] 08/16/2021

## 2021-08-16 ENCOUNTER — Encounter: Payer: Self-pay | Admitting: Gastroenterology

## 2021-08-16 ENCOUNTER — Ambulatory Visit: Payer: Medicare HMO | Admitting: Gastroenterology

## 2021-08-16 ENCOUNTER — Other Ambulatory Visit: Payer: Self-pay

## 2021-08-16 VITALS — BP 174/88 | HR 68 | Temp 97.7°F | Ht 72.0 in | Wt 195.6 lb

## 2021-08-16 DIAGNOSIS — Z1211 Encounter for screening for malignant neoplasm of colon: Secondary | ICD-10-CM | POA: Diagnosis not present

## 2021-08-16 NOTE — Patient Instructions (Signed)
We will arrange for you to have a colonoscopy in the near future with Dr. Rourk.  We will follow-up with you in the office as needed.  Do not hesitate to call if you have any new GI concerns.  It was very nice to meet you today!  Wilho Sharpley, PA-C [facility]  

## 2021-08-18 ENCOUNTER — Telehealth: Payer: Self-pay | Admitting: *Deleted

## 2021-08-18 MED ORDER — PEG 3350-KCL-NA BICARB-NACL 420 G PO SOLR: ORAL | 0 refills | Status: DC

## 2021-08-18 NOTE — Telephone Encounter (Signed)
LMOVM to call back to schedule TCS with propofol, Dr Gala Romney, ASA 2

## 2021-08-18 NOTE — Telephone Encounter (Signed)
PATIENT RETURNED CALL. He has been scheduled for 11/7 at 12:15pm. Aware will mail prep instructions and send rx to pharmacy.

## 2021-09-02 DIAGNOSIS — G894 Chronic pain syndrome: Secondary | ICD-10-CM | POA: Diagnosis not present

## 2021-09-02 DIAGNOSIS — M1991 Primary osteoarthritis, unspecified site: Secondary | ICD-10-CM | POA: Diagnosis not present

## 2021-09-06 ENCOUNTER — Ambulatory Visit (HOSPITAL_COMMUNITY)
Admission: RE | Admit: 2021-09-06 | Discharge: 2021-09-06 | Disposition: A | Discharge status: Home / Self Care | Payer: Medicare HMO | Source: Ambulatory Visit | Attending: Internal Medicine | Admitting: Internal Medicine

## 2021-09-06 ENCOUNTER — Other Ambulatory Visit: Payer: Self-pay

## 2021-09-06 ENCOUNTER — Encounter (HOSPITAL_COMMUNITY): Payer: Self-pay | Admitting: Internal Medicine

## 2021-09-06 ENCOUNTER — Ambulatory Visit (HOSPITAL_COMMUNITY): Payer: Medicare HMO | Admitting: Anesthesiology

## 2021-09-06 ENCOUNTER — Encounter (HOSPITAL_COMMUNITY)
Admission: RE | Disposition: A | Discharge status: Home / Self Care | Payer: Self-pay | Source: Ambulatory Visit | Attending: Internal Medicine

## 2021-09-06 DIAGNOSIS — D126 Benign neoplasm of colon, unspecified: Secondary | ICD-10-CM

## 2021-09-06 DIAGNOSIS — K5730 Diverticulosis of large intestine without perforation or abscess without bleeding: Secondary | ICD-10-CM | POA: Diagnosis not present

## 2021-09-06 DIAGNOSIS — D123 Benign neoplasm of transverse colon: Secondary | ICD-10-CM | POA: Insufficient documentation

## 2021-09-06 DIAGNOSIS — Z1211 Encounter for screening for malignant neoplasm of colon: Secondary | ICD-10-CM | POA: Diagnosis not present

## 2021-09-06 DIAGNOSIS — D124 Benign neoplasm of descending colon: Secondary | ICD-10-CM | POA: Insufficient documentation

## 2021-09-06 DIAGNOSIS — D120 Benign neoplasm of cecum: Secondary | ICD-10-CM | POA: Diagnosis not present

## 2021-09-06 DIAGNOSIS — Z87891 Personal history of nicotine dependence: Secondary | ICD-10-CM | POA: Diagnosis not present

## 2021-09-06 DIAGNOSIS — J449 Chronic obstructive pulmonary disease, unspecified: Secondary | ICD-10-CM | POA: Diagnosis not present

## 2021-09-06 DIAGNOSIS — Z800 Family history of malignant neoplasm of digestive organs: Secondary | ICD-10-CM | POA: Insufficient documentation

## 2021-09-06 DIAGNOSIS — K635 Polyp of colon: Secondary | ICD-10-CM | POA: Diagnosis not present

## 2021-09-06 DIAGNOSIS — Z139 Encounter for screening, unspecified: Secondary | ICD-10-CM | POA: Diagnosis not present

## 2021-09-06 HISTORY — PX: COLONOSCOPY WITH PROPOFOL: SHX5780

## 2021-09-06 HISTORY — PX: POLYPECTOMY: SHX5525

## 2021-09-06 SURGERY — COLONOSCOPY WITH PROPOFOL
Anesthesia: General

## 2021-09-06 MED ORDER — LIDOCAINE HCL (PF) 2 % IJ SOLN
INTRAMUSCULAR | Status: AC
  Filled 2021-09-06: qty 5

## 2021-09-06 MED ORDER — PROPOFOL 500 MG/50ML IV EMUL
INTRAVENOUS | Status: AC
  Filled 2021-09-06: qty 50

## 2021-09-06 MED ORDER — PROPOFOL 10 MG/ML IV BOLUS
INTRAVENOUS | Status: DC | PRN
  Administered 2021-09-06: 100 mg via INTRAVENOUS

## 2021-09-06 MED ORDER — LACTATED RINGERS IV SOLN: INTRAVENOUS | Status: DC

## 2021-09-06 MED ORDER — PROPOFOL 500 MG/50ML IV EMUL
INTRAVENOUS | Status: DC | PRN
  Administered 2021-09-06: 200 ug/kg/min via INTRAVENOUS

## 2021-09-06 MED ORDER — STERILE WATER FOR IRRIGATION IR SOLN
Status: DC | PRN
  Administered 2021-09-06: .6 mL

## 2021-09-06 MED ORDER — LIDOCAINE HCL (CARDIAC) PF 100 MG/5ML IV SOSY
PREFILLED_SYRINGE | INTRAVENOUS | Status: DC | PRN
  Administered 2021-09-06: 60 mg via INTRAVENOUS

## 2021-09-06 NOTE — Transfer of Care (Signed)
Immediate Anesthesia Transfer of Care Note  Patient: NEEL BUFFONE  Procedure(s) Performed: COLONOSCOPY WITH PROPOFOL POLYPECTOMY  Patient Location: PACU  Anesthesia Type:General  Level of Consciousness: awake, alert  and oriented  Airway & Oxygen Therapy: Patient Spontanous Breathing  Post-op Assessment: Report given to RN, Post -op Vital signs reviewed and stable and Patient moving all extremities X 4  Post vital signs: Reviewed and stable  Last Vitals:  Vitals Value Taken Time  BP    Temp    Pulse    Resp    SpO2      Last Pain:  Vitals:   09/06/21 1230  TempSrc:   PainSc: 0-No pain      Patients Stated Pain Goal: 5 (57/01/77 9390)  Complications: No notable events documented.

## 2021-09-06 NOTE — Anesthesia Postprocedure Evaluation (Signed)
Anesthesia Post Note  Patient: Marc Garcia  Procedure(s) Performed: COLONOSCOPY WITH PROPOFOL POLYPECTOMY  Patient location during evaluation: Endoscopy Anesthesia Type: General Level of consciousness: awake and alert and oriented Pain management: pain level controlled Vital Signs Assessment: post-procedure vital signs reviewed and stable Respiratory status: spontaneous breathing, nonlabored ventilation and respiratory function stable Cardiovascular status: blood pressure returned to baseline and stable Postop Assessment: no apparent nausea or vomiting Anesthetic complications: no   No notable events documented.   Last Vitals:  Vitals:   09/06/21 1300 09/06/21 1306  BP: 97/60 113/71  Pulse: 97 94  Resp: 12 17  Temp: 36.6 C   SpO2: 95% 97%    Last Pain:  Vitals:   09/06/21 1306  TempSrc:   PainSc: 0-No pain                 Mylissa Lambe C Clarrissa Shimkus

## 2021-09-06 NOTE — H&P (Signed)
@LOGO @   Primary Care Physician:  Marc Garcia Primary Gastroenterologist:  Dr. Gala Garcia  Pre-Procedure History & Physical: HPI:  Marc Garcia is a 65 y.o. male is here for a screening colonoscopy.  Diverticulosis only found 10 years ago.  No bowel symptoms currently.  No family history of any first-degree relatives with colon cancer.  Past Medical History:  Diagnosis Date   Arthritis    Borderline diabetic    Emphysema of lung ([place])    patient denies.   GERD (gastroesophageal reflux disease)    Hyperlipidemia    Hypertension     Past Surgical History:  Procedure Laterality Date   colonscopy   01/2011   Dr. Gala Garcia;  single anal papilla, left-sided diverticula   CYSTECTOMY     HERNIA REPAIR     inguinal bilat    KNEE SURGERY Right    times 2   TOTAL KNEE ARTHROPLASTY Right 05/15/2015   Procedure: RIGHT TOTAL KNEE ARTHROPLASTY;  Surgeon: Marc Cabal, MD;  Location: [facility];  Service: Orthopedics;  Laterality: Right;    Prior to Admission medications   Medication Sig Start Date End Date Taking? Authorizing Provider  atorvastatin (LIPITOR) 10 MG tablet Take 10 mg by mouth every evening.   Yes [provider]  HYDROcodone-acetaminophen (NORCO) 7.5-325 MG tablet Take 1 tablet by mouth 4 (four) times daily.   Yes [provider]  olmesartan (BENICAR) 40 MG tablet Take 40 mg by mouth every evening. 05/11/21  Yes [provider]  pantoprazole (PROTONIX) 40 MG tablet Take 40 mg by mouth every evening.   Yes [provider]  polyethylene glycol-electrolytes (NULYTELY) 420 g solution As directed 08/18/21   Marc Garcia, Marc Estimable, MD    Allergies as of 08/18/2021   (No Known Allergies)    Family History  Problem Relation Age of Onset   Stroke Mother    Cancer Father    Colon cancer Maternal Aunt        53s    Social History   Socioeconomic History   Marital status: Married    Spouse name: Not on file   Number of children: Not on file    Years of education: Not on file   Highest education level: Not on file  Occupational History   Not on file  Tobacco Use   Smoking status: Former    Packs/day: 1.00    Years: 40.00    Pack years: 40.00    Types: Cigarettes    Quit date: 05/14/2012    Years since quitting: 9.3   Smokeless tobacco: Never  Vaping Use   Vaping Use: Never used  Substance and Sexual Activity   Alcohol use: Yes    Alcohol/week: 2.0 standard drinks    Types: 2 Cans of beer per week   Drug use: Yes    Types: Marijuana    Comment: very little, last used a couple of weeks ago   Sexual activity: Not on file  Other Topics Concern   Not on file  Social History Narrative   Not on file   Social Determinants of Health   Financial Resource Strain: Not on file  Food Insecurity: Not on file  Transportation Needs: Not on file  Physical Activity: Not on file  Stress: Not on file  Social Connections: Not on file  Intimate Partner Violence: Not on file    Review of Systems: See HPI, otherwise negative ROS  Physical Exam: BP (!) 165/82   Temp 98 F (  36.7 C) (Oral)   Resp 14   Ht 6' (1.829 m)   Wt 86.2 kg   SpO2 98%   BMI 25.77 kg/m  General:   Alert,  Well-developed, well-nourished, pleasant and cooperative in NAD Lungs:  Clear throughout to auscultation.   No wheezes, crackles, or rhonchi. No acute distress. Heart:  Regular rate and rhythm; no murmurs, clicks, rubs,  or gallops. Abdomen:  Soft, nontender and nondistended. No masses, hepatosplenomegaly or hernias noted. Normal bowel sounds, without guarding, and without rebound.    Impression/Plan: Marc Garcia is now here to undergo a screening colonoscopy.  Average risk screening examination  Risks, benefits, limitations, imponderables and alternatives regarding colonoscopy have been reviewed with the patient. Questions have been answered. All parties agreeable.     Notice:  This dictation was prepared with Dragon dictation along with  smaller phrase technology. Any transcriptional errors that result from this process are unintentional and may not be corrected upon review.

## 2021-09-06 NOTE — Anesthesia Preprocedure Evaluation (Addendum)
Anesthesia Evaluation  Patient identified by MRN, date of birth, ID band Patient awake    Reviewed: Allergy & Precautions, NPO status , Patient's Chart, lab work & pertinent test results  Airway Mallampati: III  TM Distance: >3 FB Neck ROM: Full    Dental  (+) Dental Advisory Given [place] :   Pulmonary COPD, former smoker,    Pulmonary exam normal breath sounds clear to auscultation       Cardiovascular Exercise Tolerance: Good hypertension, Pt. on medications Normal cardiovascular exam Rhythm:Regular Rate:Normal     Neuro/Psych negative neurological ROS  negative psych ROS   GI/Hepatic GERD  Medicated and Controlled,(+)     substance abuse  alcohol use and marijuana use,   Endo/Other  negative endocrine ROS  Renal/GU negative Renal ROS  negative genitourinary   Musculoskeletal  (+) Arthritis , Osteoarthritis,    Abdominal   Peds negative pediatric ROS (+)  Hematology negative hematology ROS (+)   Anesthesia Other Findings   Reproductive/Obstetrics negative OB ROS                            Anesthesia Physical Anesthesia Plan  ASA: 2  Anesthesia Plan: General   Post-op Pain Management:    Induction: Intravenous  PONV Risk Score and Plan: TIVA  Airway Management Planned: Nasal Cannula and Natural Airway  Additional Equipment:   Intra-op Plan:   Post-operative Plan:   Informed Consent: I have reviewed the patients History and Physical, chart, labs and discussed the procedure including the risks, benefits and alternatives for the proposed anesthesia with the patient or authorized representative who has indicated his/her understanding and acceptance.     Dental advisory given  Plan Discussed with: CRNA and Surgeon  Anesthesia Plan Comments:         Anesthesia Quick Evaluation

## 2021-09-06 NOTE — Op Note (Signed)
[facility] Patient Name: Marc Garcia Procedure Date: 09/06/2021 12:17 PM MRN: [id number] Date of Birth: 03-23-1956 Attending MD: Norvel Richards , MD CSN: [id number] Age: 65 Admit Type: Outpatient Procedure:                Colonoscopy Indications:              Screening for colorectal malignant neoplasm Providers:                Norvel Richards, MD, Janeece Riggers, RN, Randa Spike, Technician Referring MD:              Medicines:                Propofol per Anesthesia Complications:            No immediate complications. Estimated Blood Loss:     Estimated blood loss was minimal. Procedure:                Pre-Anesthesia Assessment:                           - Prior to the procedure, a History and Physical                            was performed, and patient medications and                            allergies were reviewed. The patient's tolerance of                            previous anesthesia was also reviewed. The risks                            and benefits of the procedure and the sedation                            options and risks were discussed with the patient.                            All questions were answered, and informed consent                            was obtained. Prior Anticoagulants: The patient has                            taken no previous anticoagulant or antiplatelet                            agents. ASA Grade Assessment: II - A patient with                            mild systemic disease. After reviewing the risks  and benefits, the patient was deemed in                            satisfactory condition to undergo the procedure.                           After obtaining informed consent, the colonoscope                            was passed under direct vision. Throughout the                            procedure, the patient's blood pressure, pulse, and                             oxygen saturations were monitored continuously. The                            [id number]) scope was introduced through the                            anus and advanced to the the cecum, identified by                            appendiceal orifice and ileocecal valve. The                            colonoscopy was performed without difficulty. The                            patient tolerated the procedure well. The quality                            of the bowel preparation was adequate. Scope In: 12:29:16 PM Scope Out: 12:55:57 PM Scope Withdrawal Time: 0 hours 15 minutes 47 seconds  Total Procedure Duration: 0 hours 26 minutes 41 seconds  Findings:      The perianal and digital rectal examinations were normal.      Scattered medium-mouthed diverticula were found in the sigmoid colon and       descending colon.      Nine semi-pedunculated polyps were found in the descending colon,       hepatic flexure and ileocecal valve. The polyps were 4 to 12 mm in size.       These polyps were removed with a cold and hot snare. Resection and       retrieval were complete. Estimated blood loss was minimal.      The exam was otherwise without abnormality on direct and retroflexion       views. Impression:               - Diverticulosis in the sigmoid colon and in the                            descending colon.                           -  Nine 4 to 12 mm polyps in the descending colon,                            at the hepatic flexure and at the ileocecal valve,                            removed with a cold and hot snare. Resected and                            retrieved.                           - The examination was otherwise normal on direct                            and retroflexion views. Moderate Sedation:      Moderate (conscious) sedation was personally administered by an       anesthesia professional. The following parameters were monitored: oxygen       saturation, heart  rate, blood pressure, respiratory rate, EKG, adequacy       of pulmonary ventilation, and response to care. Recommendation:           - Patient has a contact number available for                            emergencies. The signs and symptoms of potential                            delayed complications were discussed with the                            patient. Return to normal activities tomorrow.                            Written discharge instructions were provided to the                            patient.                           - Advance diet as tolerated.                           - Continue present medications.                           - Repeat colonoscopy date to be determined after                            pending pathology results are reviewed for                            surveillance.                           - Return to GI office (  date not yet determined). Procedure Code(s):        --- Professional ---                           [phone], Colonoscopy, flexible; with removal of                            tumor(s), polyp(s), or other lesion(s) by snare                            technique Diagnosis Code(s):        --- Professional ---                           Z12.11, Encounter for screening for malignant                            neoplasm of colon                           K63.5, Polyp of colon                           K57.30, Diverticulosis of large intestine without                            perforation or abscess without bleeding CPT copyright 2019 [employer]. All rights reserved. The codes documented in this report are preliminary and upon coder review may  be revised to meet current compliance requirements. [name]. Andray Assefa, MD Norvel Richards, MD 09/06/2021 1:03:24 PM This report has been signed electronically. Number of Addenda: 0

## 2021-09-06 NOTE — Discharge Instructions (Addendum)
  Colonoscopy Discharge Instructions  Read the instructions outlined below and refer to this sheet in the next few weeks. These discharge instructions provide you with general information on caring for yourself after you leave the hospital. Your doctor may also give you specific instructions. While your treatment has been planned according to the most current medical practices available, unavoidable complications occasionally occur. If you have any problems or questions after discharge, call Dr. Gala Romney at [phone]. ACTIVITY You may resume your regular activity, but move at a slower pace for the next 24 hours.  Take frequent rest periods for the next 24 hours.  Walking will help get rid of the air and reduce the bloated feeling in your belly (abdomen).  No driving for 24 hours (because of the medicine (anesthesia) used during the test).   Do not sign any important legal documents or operate any machinery for 24 hours (because of the anesthesia used during the test).  NUTRITION Drink plenty of fluids.  You may resume your normal diet as instructed by your doctor.  Begin with a light meal and progress to your normal diet. Heavy or fried foods are harder to digest and may make you feel sick to your stomach (nauseated).  Avoid alcoholic beverages for 24 hours or as instructed.  MEDICATIONS You may resume your normal medications unless your doctor tells you otherwise.  WHAT YOU CAN EXPECT TODAY Some feelings of bloating in the abdomen.  Passage of more gas than usual.  Spotting of blood in your stool or on the toilet paper.  IF YOU HAD POLYPS REMOVED DURING THE COLONOSCOPY: No aspirin products for 7 days or as instructed.  No alcohol for 7 days or as instructed.  Eat a soft diet for the next 24 hours.  FINDING OUT THE RESULTS OF YOUR TEST Not all test results are available during your visit. If your test results are not back during the visit, make an appointment with your caregiver to find out the  results. Do not assume everything is normal if you have not heard from your caregiver or the medical facility. It is important for you to follow up on all of your test results.  SEEK IMMEDIATE MEDICAL ATTENTION IF: You have more than a spotting of blood in your stool.  Your belly is swollen (abdominal distention).  You are nauseated or vomiting.  You have a temperature over 101.  You have abdominal pain or discomfort that is severe or gets worse throughout the day.    9 polyps removed from your colon today  Diverticulosis and colon polyp information provided  Further recommendations to follow pending review of pathology report  At patient request, called Jannifer Franklin at [phone]-[phone] rolled to voicemail.  I left a message.

## 2021-09-07 LAB — SURGICAL PATHOLOGY

## 2021-09-08 ENCOUNTER — Encounter: Payer: Self-pay | Admitting: Internal Medicine

## 2021-09-09 ENCOUNTER — Encounter (HOSPITAL_COMMUNITY): Payer: Self-pay | Admitting: Internal Medicine

## 2021-09-30 DIAGNOSIS — G894 Chronic pain syndrome: Secondary | ICD-10-CM | POA: Diagnosis not present

## 2021-10-28 DIAGNOSIS — E663 Overweight: Secondary | ICD-10-CM | POA: Diagnosis not present

## 2021-10-28 DIAGNOSIS — M1991 Primary osteoarthritis, unspecified site: Secondary | ICD-10-CM | POA: Diagnosis not present

## 2021-10-28 DIAGNOSIS — Z6826 Body mass index (BMI) 26.0-26.9, adult: Secondary | ICD-10-CM | POA: Diagnosis not present

## 2021-10-28 DIAGNOSIS — K5730 Diverticulosis of large intestine without perforation or abscess without bleeding: Secondary | ICD-10-CM | POA: Diagnosis not present

## 2021-10-28 DIAGNOSIS — G894 Chronic pain syndrome: Secondary | ICD-10-CM | POA: Diagnosis not present

## 2021-11-25 DIAGNOSIS — M1991 Primary osteoarthritis, unspecified site: Secondary | ICD-10-CM | POA: Diagnosis not present

## 2021-11-25 DIAGNOSIS — G894 Chronic pain syndrome: Secondary | ICD-10-CM | POA: Diagnosis not present

## 2021-12-23 DIAGNOSIS — G894 Chronic pain syndrome: Secondary | ICD-10-CM | POA: Diagnosis not present

## 2021-12-23 DIAGNOSIS — M1991 Primary osteoarthritis, unspecified site: Secondary | ICD-10-CM | POA: Diagnosis not present

## 2022-01-20 DIAGNOSIS — G894 Chronic pain syndrome: Secondary | ICD-10-CM | POA: Diagnosis not present

## 2022-01-20 DIAGNOSIS — Z6826 Body mass index (BMI) 26.0-26.9, adult: Secondary | ICD-10-CM | POA: Diagnosis not present

## 2022-01-20 DIAGNOSIS — E663 Overweight: Secondary | ICD-10-CM | POA: Diagnosis not present

## 2022-01-20 DIAGNOSIS — H9313 Tinnitus, bilateral: Secondary | ICD-10-CM | POA: Diagnosis not present

## 2022-02-22 DIAGNOSIS — M1991 Primary osteoarthritis, unspecified site: Secondary | ICD-10-CM | POA: Diagnosis not present

## 2022-02-22 DIAGNOSIS — G894 Chronic pain syndrome: Secondary | ICD-10-CM | POA: Diagnosis not present

## 2022-02-22 DIAGNOSIS — I10 Essential (primary) hypertension: Secondary | ICD-10-CM | POA: Diagnosis not present

## 2022-03-24 DIAGNOSIS — G894 Chronic pain syndrome: Secondary | ICD-10-CM | POA: Diagnosis not present

## 2022-04-22 DIAGNOSIS — Z125 Encounter for screening for malignant neoplasm of prostate: Secondary | ICD-10-CM | POA: Diagnosis not present

## 2022-04-22 DIAGNOSIS — I10 Essential (primary) hypertension: Secondary | ICD-10-CM | POA: Diagnosis not present

## 2022-04-22 DIAGNOSIS — M1991 Primary osteoarthritis, unspecified site: Secondary | ICD-10-CM | POA: Diagnosis not present

## 2022-04-22 DIAGNOSIS — G894 Chronic pain syndrome: Secondary | ICD-10-CM | POA: Diagnosis not present

## 2022-04-22 DIAGNOSIS — E663 Overweight: Secondary | ICD-10-CM | POA: Diagnosis not present

## 2022-04-22 DIAGNOSIS — E782 Mixed hyperlipidemia: Secondary | ICD-10-CM | POA: Diagnosis not present

## 2022-04-22 DIAGNOSIS — K5730 Diverticulosis of large intestine without perforation or abscess without bleeding: Secondary | ICD-10-CM | POA: Diagnosis not present

## 2022-05-20 DIAGNOSIS — G894 Chronic pain syndrome: Secondary | ICD-10-CM | POA: Diagnosis not present

## 2022-06-20 DIAGNOSIS — G894 Chronic pain syndrome: Secondary | ICD-10-CM | POA: Diagnosis not present

## 2022-06-20 DIAGNOSIS — I10 Essential (primary) hypertension: Secondary | ICD-10-CM | POA: Diagnosis not present

## 2022-06-20 DIAGNOSIS — M1991 Primary osteoarthritis, unspecified site: Secondary | ICD-10-CM | POA: Diagnosis not present

## 2022-07-06 DIAGNOSIS — H5203 Hypermetropia, bilateral: Secondary | ICD-10-CM | POA: Diagnosis not present

## 2022-07-18 DIAGNOSIS — Z23 Encounter for immunization: Secondary | ICD-10-CM | POA: Diagnosis not present

## 2022-07-18 DIAGNOSIS — G894 Chronic pain syndrome: Secondary | ICD-10-CM | POA: Diagnosis not present

## 2022-08-12 DIAGNOSIS — M069 Rheumatoid arthritis, unspecified: Secondary | ICD-10-CM | POA: Diagnosis not present

## 2022-08-12 DIAGNOSIS — Z1331 Encounter for screening for depression: Secondary | ICD-10-CM | POA: Diagnosis not present

## 2022-08-12 DIAGNOSIS — R7301 Impaired fasting glucose: Secondary | ICD-10-CM | POA: Diagnosis not present

## 2022-08-12 DIAGNOSIS — E119 Type 2 diabetes mellitus without complications: Secondary | ICD-10-CM | POA: Diagnosis not present

## 2022-08-12 DIAGNOSIS — Z0001 Encounter for general adult medical examination with abnormal findings: Secondary | ICD-10-CM | POA: Diagnosis not present

## 2022-08-12 DIAGNOSIS — I10 Essential (primary) hypertension: Secondary | ICD-10-CM | POA: Diagnosis not present

## 2022-08-12 DIAGNOSIS — G894 Chronic pain syndrome: Secondary | ICD-10-CM | POA: Diagnosis not present

## 2022-08-12 DIAGNOSIS — K219 Gastro-esophageal reflux disease without esophagitis: Secondary | ICD-10-CM | POA: Diagnosis not present

## 2022-08-12 DIAGNOSIS — E663 Overweight: Secondary | ICD-10-CM | POA: Diagnosis not present

## 2022-08-12 DIAGNOSIS — Z6827 Body mass index (BMI) 27.0-27.9, adult: Secondary | ICD-10-CM | POA: Diagnosis not present

## 2022-08-12 DIAGNOSIS — E782 Mixed hyperlipidemia: Secondary | ICD-10-CM | POA: Diagnosis not present

## 2022-09-13 DIAGNOSIS — G894 Chronic pain syndrome: Secondary | ICD-10-CM | POA: Diagnosis not present

## 2022-10-14 DIAGNOSIS — G894 Chronic pain syndrome: Secondary | ICD-10-CM | POA: Diagnosis not present

## 2022-11-16 DIAGNOSIS — E663 Overweight: Secondary | ICD-10-CM | POA: Diagnosis not present

## 2022-11-16 DIAGNOSIS — M069 Rheumatoid arthritis, unspecified: Secondary | ICD-10-CM | POA: Diagnosis not present

## 2022-11-16 DIAGNOSIS — Z6827 Body mass index (BMI) 27.0-27.9, adult: Secondary | ICD-10-CM | POA: Diagnosis not present

## 2022-11-16 DIAGNOSIS — I10 Essential (primary) hypertension: Secondary | ICD-10-CM | POA: Diagnosis not present

## 2022-11-16 DIAGNOSIS — G894 Chronic pain syndrome: Secondary | ICD-10-CM | POA: Diagnosis not present

## 2022-11-16 DIAGNOSIS — K219 Gastro-esophageal reflux disease without esophagitis: Secondary | ICD-10-CM | POA: Diagnosis not present

## 2022-12-16 DIAGNOSIS — G894 Chronic pain syndrome: Secondary | ICD-10-CM | POA: Diagnosis not present

## 2023-01-13 DIAGNOSIS — G894 Chronic pain syndrome: Secondary | ICD-10-CM | POA: Diagnosis not present

## 2023-02-10 DIAGNOSIS — I10 Essential (primary) hypertension: Secondary | ICD-10-CM | POA: Diagnosis not present

## 2023-02-10 DIAGNOSIS — G894 Chronic pain syndrome: Secondary | ICD-10-CM | POA: Diagnosis not present

## 2023-02-10 DIAGNOSIS — E7849 Other hyperlipidemia: Secondary | ICD-10-CM | POA: Diagnosis not present

## 2023-02-10 DIAGNOSIS — E663 Overweight: Secondary | ICD-10-CM | POA: Diagnosis not present

## 2023-02-10 DIAGNOSIS — Z6827 Body mass index (BMI) 27.0-27.9, adult: Secondary | ICD-10-CM | POA: Diagnosis not present

## 2023-02-10 DIAGNOSIS — M069 Rheumatoid arthritis, unspecified: Secondary | ICD-10-CM | POA: Diagnosis not present

## 2023-02-10 DIAGNOSIS — E782 Mixed hyperlipidemia: Secondary | ICD-10-CM | POA: Diagnosis not present

## 2023-03-10 DIAGNOSIS — G894 Chronic pain syndrome: Secondary | ICD-10-CM | POA: Diagnosis not present

## 2023-04-07 DIAGNOSIS — G894 Chronic pain syndrome: Secondary | ICD-10-CM | POA: Diagnosis not present

## 2023-04-07 DIAGNOSIS — M069 Rheumatoid arthritis, unspecified: Secondary | ICD-10-CM | POA: Diagnosis not present

## 2023-05-03 DIAGNOSIS — E663 Overweight: Secondary | ICD-10-CM | POA: Diagnosis not present

## 2023-05-03 DIAGNOSIS — G894 Chronic pain syndrome: Secondary | ICD-10-CM | POA: Diagnosis not present

## 2023-05-03 DIAGNOSIS — Z6826 Body mass index (BMI) 26.0-26.9, adult: Secondary | ICD-10-CM | POA: Diagnosis not present

## 2023-06-02 DIAGNOSIS — G894 Chronic pain syndrome: Secondary | ICD-10-CM | POA: Diagnosis not present

## 2023-06-30 DIAGNOSIS — G894 Chronic pain syndrome: Secondary | ICD-10-CM | POA: Diagnosis not present

## 2023-07-27 DIAGNOSIS — Z6827 Body mass index (BMI) 27.0-27.9, adult: Secondary | ICD-10-CM | POA: Diagnosis not present

## 2023-07-27 DIAGNOSIS — G894 Chronic pain syndrome: Secondary | ICD-10-CM | POA: Diagnosis not present

## 2023-07-27 DIAGNOSIS — E663 Overweight: Secondary | ICD-10-CM | POA: Diagnosis not present

## 2023-08-25 DIAGNOSIS — G894 Chronic pain syndrome: Secondary | ICD-10-CM | POA: Diagnosis not present

## 2023-09-22 DIAGNOSIS — M069 Rheumatoid arthritis, unspecified: Secondary | ICD-10-CM | POA: Diagnosis not present

## 2023-09-22 DIAGNOSIS — G894 Chronic pain syndrome: Secondary | ICD-10-CM | POA: Diagnosis not present

## 2023-10-16 DIAGNOSIS — G894 Chronic pain syndrome: Secondary | ICD-10-CM | POA: Diagnosis not present

## 2023-10-16 DIAGNOSIS — Z6827 Body mass index (BMI) 27.0-27.9, adult: Secondary | ICD-10-CM | POA: Diagnosis not present

## 2023-10-16 DIAGNOSIS — E663 Overweight: Secondary | ICD-10-CM | POA: Diagnosis not present

## 2023-11-20 DIAGNOSIS — G894 Chronic pain syndrome: Secondary | ICD-10-CM | POA: Diagnosis not present

## 2023-12-15 DIAGNOSIS — G894 Chronic pain syndrome: Secondary | ICD-10-CM | POA: Diagnosis not present

## 2024-01-12 DIAGNOSIS — G894 Chronic pain syndrome: Secondary | ICD-10-CM | POA: Diagnosis not present

## 2024-01-12 DIAGNOSIS — Z6827 Body mass index (BMI) 27.0-27.9, adult: Secondary | ICD-10-CM | POA: Diagnosis not present

## 2024-01-12 DIAGNOSIS — E663 Overweight: Secondary | ICD-10-CM | POA: Diagnosis not present

## 2024-02-09 DIAGNOSIS — G894 Chronic pain syndrome: Secondary | ICD-10-CM | POA: Diagnosis not present

## 2024-03-08 DIAGNOSIS — G894 Chronic pain syndrome: Secondary | ICD-10-CM | POA: Diagnosis not present

## 2024-04-08 DIAGNOSIS — I10 Essential (primary) hypertension: Secondary | ICD-10-CM | POA: Diagnosis not present

## 2024-04-08 DIAGNOSIS — M069 Rheumatoid arthritis, unspecified: Secondary | ICD-10-CM | POA: Diagnosis not present

## 2024-04-08 DIAGNOSIS — E782 Mixed hyperlipidemia: Secondary | ICD-10-CM | POA: Diagnosis not present

## 2024-04-08 DIAGNOSIS — G894 Chronic pain syndrome: Secondary | ICD-10-CM | POA: Diagnosis not present

## 2024-04-08 DIAGNOSIS — Z0001 Encounter for general adult medical examination with abnormal findings: Secondary | ICD-10-CM | POA: Diagnosis not present

## 2024-05-02 DIAGNOSIS — G894 Chronic pain syndrome: Secondary | ICD-10-CM | POA: Diagnosis not present

## 2024-06-10 DIAGNOSIS — M069 Rheumatoid arthritis, unspecified: Secondary | ICD-10-CM | POA: Diagnosis not present

## 2024-06-10 DIAGNOSIS — G894 Chronic pain syndrome: Secondary | ICD-10-CM | POA: Diagnosis not present

## 2024-07-10 DIAGNOSIS — G894 Chronic pain syndrome: Secondary | ICD-10-CM | POA: Diagnosis not present

## 2024-07-10 DIAGNOSIS — Z6826 Body mass index (BMI) 26.0-26.9, adult: Secondary | ICD-10-CM | POA: Diagnosis not present

## 2024-08-01 ENCOUNTER — Encounter (INDEPENDENT_AMBULATORY_CARE_PROVIDER_SITE_OTHER): Payer: Self-pay | Admitting: *Deleted

## 2024-08-07 DIAGNOSIS — Z23 Encounter for immunization: Secondary | ICD-10-CM | POA: Diagnosis not present

## 2024-08-07 DIAGNOSIS — G8929 Other chronic pain: Secondary | ICD-10-CM | POA: Diagnosis not present

## 2024-08-08 ENCOUNTER — Telehealth: Payer: Self-pay | Admitting: Internal Medicine

## 2024-08-08 NOTE — Telephone Encounter (Signed)
 Left a message to schedule OV before procedure, tends to be constipated / uses laxatives.

## 2024-09-02 ENCOUNTER — Ambulatory Visit: Admitting: Internal Medicine

## 2024-09-02 ENCOUNTER — Other Ambulatory Visit: Payer: Self-pay | Admitting: *Deleted

## 2024-09-02 ENCOUNTER — Encounter: Payer: Self-pay | Admitting: *Deleted

## 2024-09-02 ENCOUNTER — Encounter: Payer: Self-pay | Admitting: Internal Medicine

## 2024-09-02 VITALS — BP 165/89 | HR 71 | Temp 98.2°F | Ht 72.0 in | Wt 195.8 lb

## 2024-09-02 DIAGNOSIS — K219 Gastro-esophageal reflux disease without esophagitis: Secondary | ICD-10-CM

## 2024-09-02 DIAGNOSIS — Z860100 Personal history of colon polyps, unspecified: Secondary | ICD-10-CM

## 2024-09-02 MED ORDER — LINACLOTIDE 72 MCG PO CAPS
72.0000 ug | ORAL_CAPSULE | Freq: Every day | ORAL | Status: AC
Start: 2024-09-02 — End: ?

## 2024-09-02 MED ORDER — PEG 3350-KCL-NA BICARB-NACL 420 G PO SOLR
4000.0000 mL | Freq: Once | ORAL | 0 refills | Status: AC
Start: 2024-09-02 — End: 2024-09-02

## 2024-09-02 NOTE — Patient Instructions (Signed)
 Very nice to see you again today!  As discussed you are due to have a surveillance colonoscopy at this time (history of polyps) ASA 2  We will provide Linzess 72 gelcap 1 daily for the 3 days prior to the procedure in addition to the standard prep  Continue pantoprazole  40 mg for reflux  Further recommendations to follow.

## 2024-09-02 NOTE — H&P (View-Only) (Signed)
 Gastroenterology Progress Note    Primary Care Physician:  [name] Primary Gastroenterologist:  Dr. Shaaron  Pre-Procedure History & Physical: HPI:  Marc Garcia is a 68 y.o. male here to set up a surveillance colonoscopy.  Patient had greater than 10 adenomas, at least 112 mm removed 3 years ago.  Due for surveillance colonoscopy this time.  He has got no bowel symptoms.  GERD well-controlled on Protonix  40 mg daily  Past Medical History:  Diagnosis Date   Arthritis    Borderline diabetic    Emphysema of lung ([facility])    patient denies.   GERD (gastroesophageal reflux disease)    Hyperlipidemia    Hypertension     Past Surgical History:  Procedure Laterality Date   COLONOSCOPY WITH PROPOFOL  N/A 09/06/2021   Procedure: COLONOSCOPY WITH PROPOFOL ;  Surgeon: Shaaron Lamar HERO, MD;  Location: [facility];  Service: Endoscopy;  Laterality: N/A;  12:15pm   colonscopy   01/2011   Dr. Shaaron;  single anal papilla, left-sided diverticula   CYSTECTOMY     HERNIA REPAIR     inguinal bilat    KNEE SURGERY Right    times 2   POLYPECTOMY  09/06/2021   Procedure: POLYPECTOMY;  Surgeon: Shaaron Lamar HERO, MD;  Location: [facility];  Service: Endoscopy;;   TOTAL KNEE ARTHROPLASTY Right 05/15/2015   Procedure: RIGHT TOTAL KNEE ARTHROPLASTY;  Surgeon: Lamar Collet, MD;  Location: [facility];  Service: Orthopedics;  Laterality: Right;    Prior to Admission medications   Medication Sig Start Date End Date Taking? Authorizing Provider  amLODipine (NORVASC) 5 MG tablet Take 5 mg by mouth daily.   Yes [provider]  atorvastatin  (LIPITOR) 20 MG tablet Take 20 mg by mouth daily.   Yes [provider]  HYDROcodone-acetaminophen  (NORCO) 7.5-325 MG tablet Take 1 tablet by mouth 4 (four) times daily.   Yes [provider]  pantoprazole  (PROTONIX ) 40 MG tablet Take 40 mg by mouth every evening.   Yes [provider]    Allergies as of 09/02/2024    (No Known Allergies)    Family History  Problem Relation Age of Onset   Stroke Mother    Cancer Father    Colon cancer Maternal Aunt        2s    Social History   Socioeconomic History   Marital status: Married    Spouse name: Not on file   Number of children: Not on file   Years of education: Not on file   Highest education level: Not on file  Occupational History   Not on file  Tobacco Use   Smoking status: Former    Current packs/day: 0.00    Average packs/day: 1 pack/day for 40.0 years (40.0 ttl pk-yrs)    Types: Cigarettes    Start date: 05/14/1972    Quit date: 05/14/2012    Years since quitting: 12.3   Smokeless tobacco: Never  Vaping Use   Vaping status: Never Used  Substance and Sexual Activity   Alcohol use: Yes    Alcohol/week: 2.0 standard drinks of alcohol    Types: 2 Cans of beer per week   Drug use: Yes    Types: Marijuana    Comment: very little, last used a couple of weeks ago   Sexual activity: Not on file  Other Topics Concern   Not on file  Social History Narrative   Not on file   Social Drivers  of Health   Financial Resource Strain: Not on file  Food Insecurity: Not on file  Transportation Needs: Not on file  Physical Activity: Not on file  Stress: Not on file  Social Connections: Not on file  Intimate Partner Violence: Not on file    Review of Systems   See HPI, otherwise negative ROS  Physical Exam: BP (!) 165/89 (BP Location: Left Arm, Patient Position: Sitting, Cuff Size: Large)   Pulse 71   Temp 98.2 F (36.8 C) (Oral)   Ht 6' (1.829 m)   Wt 195 lb 12.8 oz (88.8 kg)   SpO2 97%   BMI 26.56 kg/m  General:   Alert,  Well-developed, well-nourished, pleasant and cooperative in NAD Heart:  Regular rate and rhythm; no murmurs, clicks, rubs,  or gallops. Abdomen: Non-distended, normal bowel sounds.  Soft and nontender without appreciable mass or hepatosplenomegaly.   Impression/Plan:   68 year old gentleman with a relative  high burden of colonic adenomas found in 2022.  By criteria, he is due for surveillance colonoscopy now.  He is not having any chronic symptoms other than constipation-opioid associated.  I offered him a surveillance colonoscopy at this time.  The risks, benefits, limitations, alternatives and imponderables have been reviewed with the patient. Questions have been answered. All parties are agreeable.    Given ongoing opioid use will provide Linzess 72 gelcap daily x 3 days prior to initiation of prep.  GERD well-controlled on Protonix .  Continue that regimen.    Notice: This dictation was prepared with Dragon dictation along with smaller phrase technology. Any transcriptional errors that result from this process are unintentional and may not be corrected upon review.

## 2024-09-02 NOTE — Progress Notes (Unsigned)
 Gastroenterology Progress Note    Primary Care Physician:  Marc Garcia Primary Gastroenterologist:  Dr. Shaaron  Pre-Procedure History & Physical: HPI:  Marc Garcia is a 68 y.o. male here to set up a surveillance colonoscopy.  Patient had greater than 10 adenomas, at least 112 mm removed 3 years ago.  Due for surveillance colonoscopy this time.  He has got no bowel symptoms.  GERD well-controlled on Protonix  40 mg daily  Past Medical History:  Diagnosis Date   Arthritis    Borderline diabetic    Emphysema of lung ([facility])    patient denies.   GERD (gastroesophageal reflux disease)    Hyperlipidemia    Hypertension     Past Surgical History:  Procedure Laterality Date   COLONOSCOPY WITH PROPOFOL  N/A 09/06/2021   Procedure: COLONOSCOPY WITH PROPOFOL ;  Surgeon: Marc Marc HERO, MD;  Location: [facility];  Service: Endoscopy;  Laterality: N/A;  12:15pm   colonscopy   01/2011   Dr. Shaaron;  single anal papilla, left-sided diverticula   CYSTECTOMY     HERNIA REPAIR     inguinal bilat    KNEE SURGERY Right    times 2   POLYPECTOMY  09/06/2021   Procedure: POLYPECTOMY;  Surgeon: Marc Marc HERO, MD;  Location: [facility];  Service: Endoscopy;;   TOTAL KNEE ARTHROPLASTY Right 05/15/2015   Procedure: RIGHT TOTAL KNEE ARTHROPLASTY;  Surgeon: Marc Collet, MD;  Location: [facility];  Service: Orthopedics;  Laterality: Right;    Prior to Admission medications   Medication Sig Start Date End Date Taking? Authorizing Provider  amLODipine (NORVASC) 5 MG tablet Take 5 mg by mouth daily.   Yes [provider]  atorvastatin  (LIPITOR) 20 MG tablet Take 20 mg by mouth daily.   Yes [provider]  HYDROcodone-acetaminophen  (NORCO) 7.5-325 MG tablet Take 1 tablet by mouth 4 (four) times daily.   Yes [provider]  pantoprazole  (PROTONIX ) 40 MG tablet Take 40 mg by mouth every evening.   Yes [provider]    Allergies as of 09/02/2024    (No Known Allergies)    Family History  Problem Relation Age of Onset   Stroke Mother    Cancer Father    Colon cancer Maternal Aunt        2s    Social History   Socioeconomic History   Marital status: Married    Spouse name: Not on file   Number of children: Not on file   Years of education: Not on file   Highest education level: Not on file  Occupational History   Not on file  Tobacco Use   Smoking status: Former    Current packs/day: 0.00    Average packs/day: 1 pack/day for 40.0 years (40.0 ttl pk-yrs)    Types: Cigarettes    Start date: 05/14/1972    Quit date: 05/14/2012    Years since quitting: 12.3   Smokeless tobacco: Never  Vaping Use   Vaping status: Never Used  Substance and Sexual Activity   Alcohol use: Yes    Alcohol/week: 2.0 standard drinks of alcohol    Types: 2 Cans of beer per week   Drug use: Yes    Types: Marijuana    Comment: very little, last used a couple of weeks ago   Sexual activity: Not on file  Other Topics Concern   Not on file  Social History Narrative   Not on file   Social Drivers  of Health   Financial Resource Strain: Not on file  Food Insecurity: Not on file  Transportation Needs: Not on file  Physical Activity: Not on file  Stress: Not on file  Social Connections: Not on file  Intimate Partner Violence: Not on file    Review of Systems   See HPI, otherwise negative ROS  Physical Exam: BP (!) 165/89 (BP Location: Left Arm, Patient Position: Sitting, Cuff Size: Large)   Pulse 71   Temp 98.2 F (36.8 C) (Oral)   Ht 6' (1.829 m)   Wt 195 lb 12.8 oz (88.8 kg)   SpO2 97%   BMI 26.56 kg/m  General:   Alert,  Well-developed, well-nourished, pleasant and cooperative in NAD Heart:  Regular rate and rhythm; no murmurs, clicks, rubs,  or gallops. Abdomen: Non-distended, normal bowel sounds.  Soft and nontender without appreciable mass or hepatosplenomegaly.   Impression/Plan:   68 year old gentleman with a relative  high burden of colonic adenomas found in 2022.  By criteria, he is due for surveillance colonoscopy now.  He is not having any chronic symptoms other than constipation-opioid associated.  I offered him a surveillance colonoscopy at this time.  The risks, benefits, limitations, alternatives and imponderables have been reviewed with the patient. Questions have been answered. All parties are agreeable.    Given ongoing opioid use will provide Linzess 72 gelcap daily x 3 days prior to initiation of prep.  GERD well-controlled on Protonix .  Continue that regimen.    Notice: This dictation was prepared with Dragon dictation along with smaller phrase technology. Any transcriptional errors that result from this process are unintentional and may not be corrected upon review.

## 2024-09-02 NOTE — Progress Notes (Unsigned)
 Medication Samples have been provided to the patient.  Drug name: Linzess       Strength: 72 mcg        Qty: 1  LOT: [id number]  Exp.Date: 11/30/2026  Dosing instructions: take one capsule daily before breakfast three days before colonoscopy  The patient has been instructed regarding the correct time, dose, and frequency of taking this medication, including desired effects and most common side effects.   Marc Garcia 11:21 AM 09/02/2024

## 2024-09-03 ENCOUNTER — Telehealth: Payer: Self-pay | Admitting: Internal Medicine

## 2024-09-03 NOTE — Telephone Encounter (Signed)
 Patient walked into office to reschedule his colonoscopy. Pt states he has another appointment that day.

## 2024-09-04 NOTE — Telephone Encounter (Signed)
 Spoke with pt. He has been moved to 11/13. He stated he will come to [address] and pick up his new instructions.

## 2024-09-10 DIAGNOSIS — I10 Essential (primary) hypertension: Secondary | ICD-10-CM | POA: Diagnosis not present

## 2024-09-10 DIAGNOSIS — G8929 Other chronic pain: Secondary | ICD-10-CM | POA: Diagnosis not present

## 2024-09-12 ENCOUNTER — Ambulatory Visit (HOSPITAL_COMMUNITY): Admitting: Anesthesiology

## 2024-09-12 ENCOUNTER — Encounter (HOSPITAL_COMMUNITY)
Admission: RE | Disposition: A | Discharge status: Home / Self Care | Payer: Self-pay | Source: Home / Self Care | Attending: Internal Medicine

## 2024-09-12 ENCOUNTER — Other Ambulatory Visit: Payer: Self-pay

## 2024-09-12 ENCOUNTER — Encounter (HOSPITAL_COMMUNITY): Payer: Self-pay | Admitting: Internal Medicine

## 2024-09-12 ENCOUNTER — Ambulatory Visit (HOSPITAL_COMMUNITY)
Admission: RE | Admit: 2024-09-12 | Discharge: 2024-09-12 | Disposition: A | Discharge status: Home / Self Care | Attending: Internal Medicine | Admitting: Internal Medicine

## 2024-09-12 DIAGNOSIS — K5900 Constipation, unspecified: Secondary | ICD-10-CM | POA: Insufficient documentation

## 2024-09-12 DIAGNOSIS — D123 Benign neoplasm of transverse colon: Secondary | ICD-10-CM | POA: Diagnosis not present

## 2024-09-12 DIAGNOSIS — Z860100 Personal history of colon polyps, unspecified: Secondary | ICD-10-CM | POA: Diagnosis not present

## 2024-09-12 DIAGNOSIS — Z1211 Encounter for screening for malignant neoplasm of colon: Secondary | ICD-10-CM | POA: Diagnosis not present

## 2024-09-12 DIAGNOSIS — I10 Essential (primary) hypertension: Secondary | ICD-10-CM | POA: Insufficient documentation

## 2024-09-12 DIAGNOSIS — K219 Gastro-esophageal reflux disease without esophagitis: Secondary | ICD-10-CM | POA: Insufficient documentation

## 2024-09-12 DIAGNOSIS — Z79891 Long term (current) use of opiate analgesic: Secondary | ICD-10-CM | POA: Diagnosis not present

## 2024-09-12 DIAGNOSIS — Z800 Family history of malignant neoplasm of digestive organs: Secondary | ICD-10-CM | POA: Insufficient documentation

## 2024-09-12 DIAGNOSIS — Z87891 Personal history of nicotine dependence: Secondary | ICD-10-CM | POA: Diagnosis not present

## 2024-09-12 DIAGNOSIS — Z860101 Personal history of adenomatous and serrated colon polyps: Secondary | ICD-10-CM | POA: Diagnosis not present

## 2024-09-12 DIAGNOSIS — J449 Chronic obstructive pulmonary disease, unspecified: Secondary | ICD-10-CM | POA: Diagnosis not present

## 2024-09-12 DIAGNOSIS — K5730 Diverticulosis of large intestine without perforation or abscess without bleeding: Secondary | ICD-10-CM | POA: Insufficient documentation

## 2024-09-12 DIAGNOSIS — K635 Polyp of colon: Secondary | ICD-10-CM | POA: Diagnosis not present

## 2024-09-12 HISTORY — PX: COLONOSCOPY: SHX5424

## 2024-09-12 SURGERY — COLONOSCOPY
Anesthesia: Monitor Anesthesia Care

## 2024-09-12 MED ORDER — LACTATED RINGERS IV SOLN: INTRAVENOUS | Status: DC

## 2024-09-12 MED ORDER — PROPOFOL 500 MG/50ML IV EMUL
INTRAVENOUS | Status: DC | PRN
  Administered 2024-09-12: 125 ug/kg/min via INTRAVENOUS
  Administered 2024-09-12: 150 ug via INTRAVENOUS

## 2024-09-12 NOTE — Anesthesia Procedure Notes (Signed)
 Date/Time: 09/12/2024 9:36 AM  Performed by: Barbarann Verneita RAMAN, CRNAPre-anesthesia Checklist: Patient identified, Emergency Drugs available, Suction available, Timeout performed and Patient being monitored Patient Re-evaluated:Patient Re-evaluated prior to induction Oxygen Delivery Method: Nasal Cannula

## 2024-09-12 NOTE — Anesthesia Preprocedure Evaluation (Signed)
 Anesthesia Evaluation  Patient identified by MRN, date of birth, ID band Patient awake    Reviewed: Allergy & Precautions, H&P , NPO status , Patient's Chart, lab work & pertinent test results, reviewed documented beta blocker date and time   Airway Mallampati: II  TM Distance: >3 FB Neck ROM: full    Dental no notable dental hx.    Pulmonary COPD, former smoker   Pulmonary exam normal breath sounds clear to auscultation       Cardiovascular Exercise Tolerance: Good hypertension,  Rhythm:regular Rate:Normal     Neuro/Psych negative neurological ROS  negative psych ROS   GI/Hepatic Neg liver ROS,GERD  ,,  Endo/Other  negative endocrine ROS    Renal/GU negative Renal ROS  negative genitourinary   Musculoskeletal   Abdominal   Peds  Hematology negative hematology ROS (+)   Anesthesia Other Findings   Reproductive/Obstetrics negative OB ROS                              Anesthesia Physical Anesthesia Plan  ASA: 2  Anesthesia Plan: MAC   Post-op Pain Management:    Induction:   PONV Risk Score and Plan: Propofol  infusion  Airway Management Planned:   Additional Equipment:   Intra-op Plan:   Post-operative Plan:   Informed Consent: I have reviewed the patients History and Physical, chart, labs and discussed the procedure including the risks, benefits and alternatives for the proposed anesthesia with the patient or authorized representative who has indicated his/her understanding and acceptance.     Dental Advisory Given  Plan Discussed with: CRNA  Anesthesia Plan Comments:         Anesthesia Quick Evaluation

## 2024-09-12 NOTE — Interval H&P Note (Signed)
 History and Physical Interval Note:  09/12/2024 9:30 AM  Marc Garcia  has presented today for surgery, with the diagnosis of HX POLYPS.  The various methods of treatment have been discussed with the patient and family. After consideration of risks, benefits and other options for treatment, the patient has consented to  Procedure(s) with comments: COLONOSCOPY (N/A) - 12:00PM, ASA 2 as a surgical intervention.  The patient's history has been reviewed, patient examined, no change in status, stable for surgery.  I have reviewed the patient's chart and labs.  Questions were answered to the patient's satisfaction.     Marc Garcia  No change.  Surveillance colonoscopy per plan.  The risks, benefits, limitations, alternatives and imponderables have been reviewed with the patient. Questions have been answered. All parties are agreeable.

## 2024-09-12 NOTE — Transfer of Care (Signed)
 Immediate Anesthesia Transfer of Care Note  Patient: Marc Garcia  Procedure(s) Performed: COLONOSCOPY  Patient Location: Endoscopy Unit  Anesthesia Type:MAC  Level of Consciousness: awake and patient cooperative  Airway & Oxygen Therapy: Patient Spontanous Breathing  Post-op Assessment: Report given to RN and Post -op Vital signs reviewed and stable  Post vital signs: Reviewed and stable  Last Vitals:  Vitals Value Taken Time  BP 92/51 09/12/24 10:02  Temp 36.6 C 09/12/24 10:02  Pulse 83 09/12/24 10:02  Resp 20 09/12/24 10:02  SpO2 98 % 09/12/24 10:02    Last Pain:  Vitals:   09/12/24 1002  TempSrc: Oral  PainSc: 0-No pain      Patients Stated Pain Goal: 5 (09/12/24 0828)  Complications: No notable events documented.

## 2024-09-12 NOTE — Discharge Instructions (Signed)
  Colonoscopy Discharge Instructions  Read the instructions outlined below and refer to this sheet in the next few weeks. These discharge instructions provide you with general information on caring for yourself after you leave the hospital. Your doctor may also give you specific instructions. While your treatment has been planned according to the most current medical practices available, unavoidable complications occasionally occur. If you have any problems or questions after discharge, call Dr. Shaaron at [phone]. ACTIVITY You may resume your regular activity, but move at a slower pace for the next 24 hours.  Take frequent rest periods for the next 24 hours.  Walking will help get rid of the air and reduce the bloated feeling in your belly (abdomen).  No driving for 24 hours (because of the medicine (anesthesia) used during the test).   Do not sign any important legal documents or operate any machinery for 24 hours (because of the anesthesia used during the test).  NUTRITION Drink plenty of fluids.  You may resume your normal diet as instructed by your doctor.  Begin with a light meal and progress to your normal diet. Heavy or fried foods are harder to digest and may make you feel sick to your stomach (nauseated).  Avoid alcoholic beverages for 24 hours or as instructed.  MEDICATIONS You may resume your normal medications unless your doctor tells you otherwise.  WHAT YOU CAN EXPECT TODAY Some feelings of bloating in the abdomen.  Passage of more gas than usual.  Spotting of blood in your stool or on the toilet paper.  IF YOU HAD POLYPS REMOVED DURING THE COLONOSCOPY: No aspirin  products for 7 days or as instructed.  No alcohol for 7 days or as instructed.  Eat a soft diet for the next 24 hours.  FINDING OUT THE RESULTS OF YOUR TEST Not all test results are available during your visit. If your test results are not back during the visit, make an appointment with your caregiver to find out the  results. Do not assume everything is normal if you have not heard from your caregiver or the medical facility. It is important for you to follow up on all of your test results.  SEEK IMMEDIATE MEDICAL ATTENTION IF: You have more than a spotting of blood in your stool.  Your belly is swollen (abdominal distention).  You are nauseated or vomiting.  You have a temperature over 101.  You have abdominal pain or discomfort that is severe or gets worse throughout the day.    1 polyp found and removed today  Diverticulosis present  Further recommendations to follow pending review of pathology report

## 2024-09-13 LAB — SURGICAL PATHOLOGY

## 2024-09-13 NOTE — Anesthesia Postprocedure Evaluation (Signed)
 Anesthesia Post Note  Patient: Marc Garcia  Procedure(s) Performed: COLONOSCOPY  Patient location during evaluation: Phase II Anesthesia Type: MAC Level of consciousness: awake Pain management: pain level controlled Vital Signs Assessment: post-procedure vital signs reviewed and stable Respiratory status: spontaneous breathing and respiratory function stable Cardiovascular status: blood pressure returned to baseline and stable Postop Assessment: no headache and no apparent nausea or vomiting Anesthetic complications: no Comments: Late entry   No notable events documented.   Last Vitals:  Vitals:   09/12/24 1002 09/12/24 1005  BP: (!) 92/51 105/89  Pulse: 83   Resp: 20   Temp: 36.6 C   SpO2: 98%     Last Pain:  Vitals:   09/12/24 1002  TempSrc: Oral  PainSc: 0-No pain                 Yvonna JINNY [name]

## 2024-09-15 ENCOUNTER — Ambulatory Visit: Payer: Self-pay | Admitting: Internal Medicine

## 2024-09-15 NOTE — Op Note (Signed)
 [facility] Patient Name: Marc Garcia Procedure Date: 09/12/2024 9:23 AM MRN: [id number] Date of Birth: 11-Dec-1955 Attending MD: Lamar Ozell Hollingshead , MD, [id number] CSN: [id number] Age: 68 Admit Type: Outpatient Procedure:                Colonoscopy Indications:              High risk colon cancer surveillance: Personal                            history of colonic polyps Providers:                Lamar Ozell Hollingshead, MD, Crystal Page, Madelin Hunter, RN Referring MD:              Medicines:                Propofol  per Anesthesia Complications:            No immediate complications. Estimated Blood Loss:     Estimated blood loss was minimal. Procedure:                Pre-Anesthesia Assessment:                           - Prior to the procedure, a History and Physical                            was performed, and patient medications and                            allergies were reviewed. The patient's tolerance of                            previous anesthesia was also reviewed. The risks                            and benefits of the procedure and the sedation                            options and risks were discussed with the patient.                            All questions were answered, and informed consent                            was obtained. Prior Anticoagulants: The patient has                            taken no anticoagulant or antiplatelet agents. ASA                            Grade Assessment: II - A patient with mild systemic  disease. After reviewing the risks and benefits,                            the patient was deemed in satisfactory condition to                            undergo the procedure.                           After obtaining informed consent, the colonoscope                            was passed under direct vision. Throughout the                            procedure, the patient's blood  pressure, pulse, and                            oxygen saturations were monitored continuously. The                            [id number] ([id number]) Colon was introduced through                            the anus and advanced to the the cecum, identified                            by appendiceal orifice and ileocecal valve. The                            colonoscopy was performed without difficulty. The                            patient tolerated the procedure well. The quality                            of the bowel preparation was adequate. The                            ileocecal valve, appendiceal orifice, and rectum                            were photographed. Scope In: 9:41:07 AM Scope Out: 9:57:25 AM Scope Withdrawal Time: 0 hours 12 minutes 2 seconds  Total Procedure Duration: 0 hours 16 minutes 18 seconds  Findings:      The perianal and digital rectal examinations were normal.      Scattered medium-mouthed diverticula were found in the sigmoid colon and       splenic flexure.      A 5 mm polyp was found in the splenic flexure. The polyp was       semi-pedunculated. The polyp was removed with a cold snare. Resection       and retrieval were complete. Estimated blood loss was minimal.      The exam was otherwise without abnormality on direct and  retroflexion       views. Impression:               - Diverticulosis in the sigmoid colon and at the                            splenic flexure.                           - One 5 mm polyp at the splenic flexure, removed                            with a cold snare. Resected and retrieved.                           - The examination was otherwise normal on direct                            and retroflexion views. Moderate Sedation:      Moderate (conscious) sedation was personally administered by an       anesthesia professional. The following parameters were monitored: oxygen       saturation, heart rate, blood pressure, respiratory  rate, EKG, adequacy       of pulmonary ventilation, and response to care. Recommendation:           - Patient has a contact number available for                            emergencies. The signs and symptoms of potential                            delayed complications were discussed with the                            patient. Return to normal activities tomorrow.                            Written discharge instructions were provided to the                            patient.                           - Advance diet as tolerated.                           - Continue present medications.                           - Repeat colonoscopy date to be determined after                            pending pathology results are reviewed for                            surveillance.                           -  Return to GI office (date not yet determined). Procedure Code(s):        --- Professional ---                           [phone], Colonoscopy, flexible; with removal of                            tumor(s), polyp(s), or other lesion(s) by snare                            technique Diagnosis Code(s):        --- Professional ---                           Z86.010, Personal history of colonic polyps                           D12.3, Benign neoplasm of transverse colon (hepatic                            flexure or splenic flexure)                           K57.30, Diverticulosis of large intestine without                            perforation or abscess without bleeding CPT copyright 2022 [employer]. All rights reserved. The codes documented in this report are preliminary and upon coder review may  be revised to meet current compliance requirements. [name], MD Lamar Ozell Hollingshead, MD 09/15/2024 1:23:30 PM This report has been signed electronically. Number of Addenda: 0

## 2024-09-16 ENCOUNTER — Encounter (HOSPITAL_COMMUNITY): Payer: Self-pay | Admitting: Internal Medicine

## 2024-09-18 DIAGNOSIS — K5730 Diverticulosis of large intestine without perforation or abscess without bleeding: Secondary | ICD-10-CM

## 2024-09-18 DIAGNOSIS — D123 Benign neoplasm of transverse colon: Secondary | ICD-10-CM | POA: Diagnosis not present

## 2024-09-18 DIAGNOSIS — I10 Essential (primary) hypertension: Secondary | ICD-10-CM

## 2024-09-18 DIAGNOSIS — Z1211 Encounter for screening for malignant neoplasm of colon: Secondary | ICD-10-CM

## 2024-10-10 DIAGNOSIS — K635 Polyp of colon: Secondary | ICD-10-CM | POA: Diagnosis not present

## 2024-10-10 DIAGNOSIS — G8929 Other chronic pain: Secondary | ICD-10-CM | POA: Diagnosis not present

## 2024-10-10 DIAGNOSIS — I10 Essential (primary) hypertension: Secondary | ICD-10-CM | POA: Diagnosis not present
